# Patient Record
Sex: Female | Born: 1975 | Race: White | Hispanic: No | Marital: Married | Smoking: Never smoker
Health system: Southern US, Community
[De-identification: ages and names within clinical notes are randomized; demographics above are authoritative.]

## PROBLEM LIST (undated history)

## (undated) ENCOUNTER — Inpatient Hospital Stay (HOSPITAL_COMMUNITY): Payer: Self-pay

## (undated) DIAGNOSIS — Z862 Personal history of diseases of the blood and blood-forming organs and certain disorders involving the immune mechanism: Secondary | ICD-10-CM

## (undated) DIAGNOSIS — B977 Papillomavirus as the cause of diseases classified elsewhere: Secondary | ICD-10-CM

## (undated) DIAGNOSIS — K219 Gastro-esophageal reflux disease without esophagitis: Secondary | ICD-10-CM

## (undated) DIAGNOSIS — Z9889 Other specified postprocedural states: Secondary | ICD-10-CM

## (undated) DIAGNOSIS — G43909 Migraine, unspecified, not intractable, without status migrainosus: Secondary | ICD-10-CM

## (undated) DIAGNOSIS — Z87442 Personal history of urinary calculi: Secondary | ICD-10-CM

## (undated) DIAGNOSIS — Z8709 Personal history of other diseases of the respiratory system: Secondary | ICD-10-CM

## (undated) DIAGNOSIS — R87629 Unspecified abnormal cytological findings in specimens from vagina: Secondary | ICD-10-CM

## (undated) DIAGNOSIS — F419 Anxiety disorder, unspecified: Secondary | ICD-10-CM

## (undated) DIAGNOSIS — R112 Nausea with vomiting, unspecified: Secondary | ICD-10-CM

## (undated) DIAGNOSIS — B009 Herpesviral infection, unspecified: Secondary | ICD-10-CM

## (undated) HISTORY — DX: Personal history of urinary calculi: Z87.442

## (undated) HISTORY — DX: Herpesviral infection, unspecified: B00.9

## (undated) HISTORY — DX: Migraine, unspecified, not intractable, without status migrainosus: G43.909

## (undated) HISTORY — PX: APPENDECTOMY: SHX54

## (undated) HISTORY — PX: WISDOM TOOTH EXTRACTION: SHX21

## (undated) HISTORY — DX: Papillomavirus as the cause of diseases classified elsewhere: B97.7

## (undated) SURGERY — Surgical Case
Anesthesia: *Unknown

---

## 2005-09-20 ENCOUNTER — Inpatient Hospital Stay (HOSPITAL_COMMUNITY): Admission: AD | Admit: 2005-09-20 | Discharge: 2005-09-22 | Payer: Self-pay | Admitting: Urology

## 2005-10-30 ENCOUNTER — Other Ambulatory Visit: Admission: RE | Admit: 2005-10-30 | Discharge: 2005-10-30 | Payer: Self-pay | Admitting: Obstetrics and Gynecology

## 2008-04-19 ENCOUNTER — Other Ambulatory Visit: Admission: RE | Admit: 2008-04-19 | Discharge: 2008-04-19 | Payer: Self-pay | Admitting: Obstetrics and Gynecology

## 2009-05-25 ENCOUNTER — Other Ambulatory Visit: Admission: RE | Admit: 2009-05-25 | Discharge: 2009-05-25 | Payer: Self-pay | Admitting: Obstetrics and Gynecology

## 2010-06-21 ENCOUNTER — Other Ambulatory Visit: Payer: Self-pay | Admitting: Adult Health

## 2010-06-21 ENCOUNTER — Other Ambulatory Visit (HOSPITAL_COMMUNITY)
Admission: RE | Admit: 2010-06-21 | Discharge: 2010-06-21 | Disposition: A | Discharge status: Home / Self Care | Payer: BC Managed Care – PPO | Source: Ambulatory Visit | Attending: Obstetrics and Gynecology | Admitting: Obstetrics and Gynecology

## 2010-06-21 DIAGNOSIS — Z124 Encounter for screening for malignant neoplasm of cervix: Secondary | ICD-10-CM | POA: Insufficient documentation

## 2010-09-29 NOTE — H&P (Signed)
NAME:  Kimberly Holt, Kimberly Holt NO.:  [id number]   MEDICAL RECORD NO.:  [id number]          PATIENT TYPE:  MAT   LOCATION:  [facility]                          FACILITY:  [facility]   PHYSICIAN:  Naima A. Dillard, M.D. DATE OF BIRTH:  Oct 04, 1975   DATE OF ADMISSION:  09/20/2005  DATE OF DISCHARGE:                                HISTORY & PHYSICAL   HISTORY OF PRESENT ILLNESS:  This is a 35 year old gravida 1, para 0 at 49-  5/7 weeks, who presents with gush of fluid with a few contractions that are  now increased.  She reports positive fetal movement.  Pregnancy has been  followed by the nurse midwife service and remarkable for:  #1 - First  trimester bleeding, #2 - HSV-2, #3 - migraines, #4 - group B strep negative.   ALLERGIES:  CODEINE causes vomiting.   OBSTETRICAL HISTORY:  The patient is a primigravida.   MEDICAL HISTORY:  1.  Abnormal Pap in 2002.  2.  History of HSV-2 and childhood varicella.  3.  She also has a history of anemia that did not require iron.  4.  She has a history of migraines.   FAMILY HISTORY:  Family history is remarkable for an uncle with heart  disease, father with hypertension, father with diabetes, mother and cousin  with thyroid disease, aunt with seizures, a cousin with MS and several  family members with brain and lung cancer.   GENETIC HISTORY:  Genetic history is remarkable for uncle with a congenital  heart problem and several family members with twins and father of the baby's  sister with a SIDS baby.   SOCIAL HISTORY:  The patient is married to [place], who is involved and  supportive.  She is of the [place] faith.  She denies any alcohol, tobacco  or drug use.   PRENATAL LABORATORY DATA:  Hemoglobin 13.5, platelets 195,000.  Blood type O-  positive, antibody screen negative.  RPR negative.  Rubella immune.  Hepatitis and HIV both negative.  Gonorrhea and Chlamydia negative.   HISTORY OF CURRENT PREGNANCY:  The patient entered care at 6  weeks'  gestation.  She had an ultrasound to confirm dates.  She had a normal nuchal  translucency ultrasound at 11 weeks.  Quad screen was normal.  She had an  anatomy ultrasound at 18 weeks that was normal, questionable femur length  shortening on early ultrasound, which later was not confirmed and was found  to be normal.  She had a Glucola that was normal and group B strep at term  was negative.   OBJECTIVE DATA:  VITAL SIGNS:  Stable, afebrile.  HEENT:  Within normal limits.  NECK:  Thyroid normal, not enlarged.  CHEST:  Clear to auscultation.  HEART:  Regular rate and rhythm.  ABDOMEN:  Gravid, 38 cm, vertex to Leopold's.  EFM shows reactive fetal  heart rate with contractions every 2-3 minutes.  PELVIC:  Exam shows no herpetic lesions visible.  Cervix is 3-4, 90%, -1  station with a vertex presentation.  EXTREMITIES:  Within normal limits.   ASSESSMENT:  1.  Intrauterine pregnancy at 38-5/7 weeks.  2.  Premature rupture of membranes at term.  3.  Early labor.   PLAN:  1.  Admit to birthing suites, Dr. Normand Sloop notified.  2.  Routine C.N.M. orders.  3.  Stadol and epidural p.r.n.      Marie L. Williams, C.N.M.      Naima A. Normand Sloop, M.D.  Electronically Signed    MLW/MEDQ  D:  09/20/2005  T:  09/20/2005  Job:  [id number]

## 2011-01-29 ENCOUNTER — Other Ambulatory Visit: Payer: Self-pay | Admitting: Adult Health

## 2011-08-29 ENCOUNTER — Other Ambulatory Visit (HOSPITAL_COMMUNITY): Payer: Self-pay | Admitting: Urology

## 2011-08-29 ENCOUNTER — Ambulatory Visit (HOSPITAL_COMMUNITY)
Admission: RE | Admit: 2011-08-29 | Discharge: 2011-08-29 | Disposition: A | Discharge status: Home / Self Care | Payer: BC Managed Care – PPO | Source: Ambulatory Visit | Attending: Urology | Admitting: Urology

## 2011-08-29 DIAGNOSIS — N201 Calculus of ureter: Secondary | ICD-10-CM | POA: Insufficient documentation

## 2011-09-27 ENCOUNTER — Other Ambulatory Visit: Payer: Self-pay | Admitting: Urology

## 2011-10-01 ENCOUNTER — Encounter (HOSPITAL_BASED_OUTPATIENT_CLINIC_OR_DEPARTMENT_OTHER): Payer: Self-pay | Admitting: *Deleted

## 2011-10-01 NOTE — Progress Notes (Signed)
NPO AFTER MN. ARRIVES AT 0745. NEEDS HG AND URINE PREG.

## 2011-10-04 NOTE — Anesthesia Preprocedure Evaluation (Addendum)
Anesthesia Evaluation  Patient identified by MRN, date of birth, ID band Patient awake    Reviewed: Allergy & Precautions, H&P , NPO status , Patient's Chart, lab work & pertinent test results  Airway Mallampati: II TM Distance: >3 FB Neck ROM: Full    Dental No notable dental hx.    Pulmonary neg pulmonary ROS,  breath sounds clear to auscultation  Pulmonary exam normal       Cardiovascular negative cardio ROS  Rhythm:Regular Rate:Normal     Neuro/Psych negative neurological ROS  negative psych ROS   GI/Hepatic negative GI ROS, Neg liver ROS,   Endo/Other  negative endocrine ROS  Renal/GU negative Renal ROS  negative genitourinary   Musculoskeletal negative musculoskeletal ROS (+)   Abdominal   Peds negative pediatric ROS (+)  Hematology negative hematology ROS (+)   Anesthesia Other Findings   Reproductive/Obstetrics negative OB ROS                           Anesthesia Physical Anesthesia Plan  ASA: I  Anesthesia Plan: General   Post-op Pain Management:    Induction: Intravenous  Airway Management Planned: LMA  Additional Equipment:   Intra-op Plan:   Post-operative Plan: Extubation in OR  Informed Consent: I have reviewed the patients History and Physical, chart, labs and discussed the procedure including the risks, benefits and alternatives for the proposed anesthesia with the patient or authorized representative who has indicated his/her understanding and acceptance.   Dental advisory given  Plan Discussed with: CRNA  Anesthesia Plan Comments: (Easily nauseated with narcotics. Does well with toradol. Phenergan helps.)       Anesthesia Quick Evaluation

## 2011-10-05 ENCOUNTER — Encounter (HOSPITAL_BASED_OUTPATIENT_CLINIC_OR_DEPARTMENT_OTHER): Payer: Self-pay | Admitting: Anesthesiology

## 2011-10-05 ENCOUNTER — Encounter (HOSPITAL_BASED_OUTPATIENT_CLINIC_OR_DEPARTMENT_OTHER)
Admission: RE | Disposition: A | Discharge status: Home / Self Care | Payer: Self-pay | Source: Ambulatory Visit | Attending: Urology

## 2011-10-05 ENCOUNTER — Ambulatory Visit (HOSPITAL_BASED_OUTPATIENT_CLINIC_OR_DEPARTMENT_OTHER)
Admission: RE | Admit: 2011-10-05 | Discharge: 2011-10-05 | Disposition: A | Discharge status: Home / Self Care | Payer: BC Managed Care – PPO | Source: Ambulatory Visit | Attending: Urology | Admitting: Urology

## 2011-10-05 ENCOUNTER — Ambulatory Visit (HOSPITAL_BASED_OUTPATIENT_CLINIC_OR_DEPARTMENT_OTHER): Payer: BC Managed Care – PPO | Admitting: Anesthesiology

## 2011-10-05 ENCOUNTER — Encounter (HOSPITAL_BASED_OUTPATIENT_CLINIC_OR_DEPARTMENT_OTHER): Payer: Self-pay | Admitting: *Deleted

## 2011-10-05 DIAGNOSIS — Z79899 Other long term (current) drug therapy: Secondary | ICD-10-CM | POA: Insufficient documentation

## 2011-10-05 DIAGNOSIS — N201 Calculus of ureter: Secondary | ICD-10-CM | POA: Insufficient documentation

## 2011-10-05 HISTORY — DX: Personal history of diseases of the blood and blood-forming organs and certain disorders involving the immune mechanism: Z86.2

## 2011-10-05 HISTORY — PX: CYSTOSCOPY/RETROGRADE/URETEROSCOPY: SHX5316

## 2011-10-05 LAB — POCT PREGNANCY, URINE: Preg Test, Ur: NEGATIVE

## 2011-10-05 LAB — POCT HEMOGLOBIN-HEMACUE: Hemoglobin: 12.6 g/dL (ref 12.0–15.0)

## 2011-10-05 SURGERY — CYSTOSCOPY/RETROGRADE/URETEROSCOPY
Anesthesia: General | Site: Ureter | Laterality: Right | Wound class: Clean Contaminated

## 2011-10-05 MED ORDER — FENTANYL CITRATE 0.05 MG/ML IJ SOLN: 25.0000 ug | INTRAMUSCULAR | Status: DC | PRN

## 2011-10-05 MED ORDER — ONDANSETRON HCL 4 MG/2ML IJ SOLN
INTRAMUSCULAR | Status: DC | PRN
  Administered 2011-10-05: 4 mg via INTRAVENOUS

## 2011-10-05 MED ORDER — LIDOCAINE HCL 2 % EX GEL
CUTANEOUS | Status: DC | PRN
  Administered 2011-10-05: 1

## 2011-10-05 MED ORDER — IOHEXOL 350 MG/ML SOLN
INTRAVENOUS | Status: DC | PRN
  Administered 2011-10-05: 50 mL

## 2011-10-05 MED ORDER — LACTATED RINGERS IV SOLN
INTRAVENOUS | Status: DC
  Administered 2011-10-05: 10:00:00 via INTRAVENOUS
  Administered 2011-10-05: 100 mL/h via INTRAVENOUS

## 2011-10-05 MED ORDER — METOCLOPRAMIDE HCL 5 MG/ML IJ SOLN
INTRAMUSCULAR | Status: DC | PRN
  Administered 2011-10-05: 5 mg via INTRAVENOUS

## 2011-10-05 MED ORDER — PROMETHAZINE HCL 25 MG/ML IJ SOLN: 6.2500 mg | INTRAMUSCULAR | Status: DC | PRN

## 2011-10-05 MED ORDER — MIDAZOLAM HCL 5 MG/5ML IJ SOLN
INTRAMUSCULAR | Status: DC | PRN
  Administered 2011-10-05: 2 mg via INTRAVENOUS

## 2011-10-05 MED ORDER — DROPERIDOL 2.5 MG/ML IJ SOLN
INTRAMUSCULAR | Status: DC | PRN
  Administered 2011-10-05: 0.625 mg via INTRAVENOUS

## 2011-10-05 MED ORDER — PROPOFOL 10 MG/ML IV EMUL
INTRAVENOUS | Status: DC | PRN
  Administered 2011-10-05: 200 mg via INTRAVENOUS

## 2011-10-05 MED ORDER — KETOROLAC TROMETHAMINE 30 MG/ML IJ SOLN
INTRAMUSCULAR | Status: DC | PRN
  Administered 2011-10-05: 30 mg via INTRAVENOUS

## 2011-10-05 MED ORDER — CEFAZOLIN SODIUM 1-5 GM-% IV SOLN
1.0000 g | INTRAVENOUS | Status: AC
  Administered 2011-10-05: 1 g via INTRAVENOUS

## 2011-10-05 MED ORDER — SODIUM CHLORIDE 0.9 % IR SOLN
Status: DC | PRN
  Administered 2011-10-05: 6000 mL

## 2011-10-05 MED ORDER — FENTANYL CITRATE 0.05 MG/ML IJ SOLN
INTRAMUSCULAR | Status: DC | PRN
  Administered 2011-10-05: 25 ug via INTRAVENOUS
  Administered 2011-10-05: 50 ug via INTRAVENOUS
  Administered 2011-10-05: 25 ug via INTRAVENOUS
  Administered 2011-10-05 (×2): 50 ug via INTRAVENOUS

## 2011-10-05 MED ORDER — DEXAMETHASONE SODIUM PHOSPHATE 4 MG/ML IJ SOLN
INTRAMUSCULAR | Status: DC | PRN
  Administered 2011-10-05: 10 mg via INTRAVENOUS

## 2011-10-05 MED ORDER — LIDOCAINE HCL (CARDIAC) 20 MG/ML IV SOLN
INTRAVENOUS | Status: DC | PRN
  Administered 2011-10-05: 80 mg via INTRAVENOUS

## 2011-10-05 SURGICAL SUPPLY — 34 items
ADAPTER CATH URET PLST 4-6FR (CATHETERS) IMPLANT
BAG DRAIN URO-CYSTO SKYTR STRL (DRAIN) ×2 IMPLANT
BASKET LASER NITINOL 1.9FR (BASKET) IMPLANT
BASKET STNLS GEMINI 4WIRE 3FR (BASKET) IMPLANT
BASKET ZERO TIP NITINOL 2.4FR (BASKET) ×2 IMPLANT
BRUSH URET BIOPSY 3F (UROLOGICAL SUPPLIES) IMPLANT
CANISTER SUCT LVC 12 LTR MEDI- (MISCELLANEOUS) ×2 IMPLANT
CATH INTERMIT  6FR 70CM (CATHETERS) IMPLANT
CATH URET 5FR 28IN CONE TIP (BALLOONS) ×1
CATH URET 5FR 28IN OPEN ENDED (CATHETERS) IMPLANT
CATH URET 5FR 70CM CONE TIP (BALLOONS) ×1 IMPLANT
CLOTH BEACON ORANGE TIMEOUT ST (SAFETY) ×2 IMPLANT
DRAPE CAMERA CLOSED 9X96 (DRAPES) ×2 IMPLANT
ELECT REM PT RETURN 9FT ADLT (ELECTROSURGICAL)
ELECTRODE REM PT RTRN 9FT ADLT (ELECTROSURGICAL) IMPLANT
GLOVE BIO SURGEON STRL SZ7 (GLOVE) ×2 IMPLANT
GLOVE BIOGEL PI IND STRL 7.0 (GLOVE) ×1 IMPLANT
GLOVE BIOGEL PI INDICATOR 7.0 (GLOVE) ×1
GLOVE ECLIPSE 7.0 STRL STRAW (GLOVE) ×2 IMPLANT
GOWN PREVENTION PLUS LG XLONG (DISPOSABLE) IMPLANT
GUIDEWIRE 0.038 PTFE COATED (WIRE) IMPLANT
GUIDEWIRE ANG ZIPWIRE 038X150 (WIRE) IMPLANT
GUIDEWIRE STR DUAL SENSOR (WIRE) ×2 IMPLANT
IV NS IRRIG 3000ML ARTHROMATIC (IV SOLUTION) ×8 IMPLANT
KIT BALLIN UROMAX 15FX10 (LABEL) IMPLANT
KIT BALLN UROMAX 15FX4 (MISCELLANEOUS) IMPLANT
KIT BALLN UROMAX 26 75X4 (MISCELLANEOUS)
LASER FIBER DISP (UROLOGICAL SUPPLIES) IMPLANT
LASER FIBER DISP 1000U (UROLOGICAL SUPPLIES) IMPLANT
NS IRRIG 500ML POUR BTL (IV SOLUTION) ×2 IMPLANT
PACK CYSTOSCOPY (CUSTOM PROCEDURE TRAY) ×2 IMPLANT
SET HIGH PRES BAL DIL (LABEL)
SHEATH URET ACCESS 12FR/35CM (UROLOGICAL SUPPLIES) ×2 IMPLANT
SHEATH URET ACCESS 12FR/55CM (UROLOGICAL SUPPLIES) IMPLANT

## 2011-10-05 NOTE — Transfer of Care (Signed)
Immediate Anesthesia Transfer of Care Note  Patient: Kimberly Holt  Procedure(s) Performed: Procedure(s) (LRB): CYSTOSCOPY/RETROGRADE/URETEROSCOPY (Right) HOLMIUM LASER APPLICATION (Right) CYSTOSCOPY WITH STENT PLACEMENT (Right)  Patient Location: PACU  Anesthesia Type: General  Level of Consciousness: awake,drowsy and oriented  Airway & Oxygen Therapy: Patient Spontanous Breathing and Patient connected to nasal cannula oxygen  Post-op Assessment: Report given to PACU RN and Post -op Vital signs reviewed and stable  Post vital signs: Reviewed and stable  Complications: No apparent anesthesia complications

## 2011-10-05 NOTE — Op Note (Signed)
Kimberly Holt is a 36 y.o.   10/05/2011  Preop diagnosis: Right distal ureteral calculus.  Postop diagnosis: Same  Procedure done: Cystoscopy, right retrograde pyelogram, ureteroscopy, holmium laser of ureteral stone, stone extraction, insertion of double-J stent.  Surgeon: Wendie Simmer. Linton Stolp  Anesthesia: General  Indication: Patient is a 36 years old female who has been complaining of right lower quadrant pain on and off for the past 4 weeks. A CT scan showed a 5 mm stone in the proximal ureter. She was treated with medical expulsive therapy. She has not been able to pass the stone and has continued to complain of pain. KUB revealed that the stone had migrated in the distal ureter. She is scheduled today for cystoscopy, stone manipulation.  Procedure: Patient was identified by her wrist band and proper timeout was taken.  Under general anesthesia she was prepped and draped and placed in the dorsolithotomy position. It was difficult to pass a panendoscope through the meatus. The meatus was then dilated up to #28 [place]. The cystoscope was then inserted in the bladder. The bladder mucosa is normal. There is no stone or tumor in the bladder. The ureteral orifices are in normal position and shape.  Retrograde pyelogram:  A cone-tip catheter was passed through the cystoscope and the right ureteral orifice. Omnipaque was then injected through the cone-tip catheter. There is a filling defect in the distal ureter about 5 cm proximal to the ureteral orifice. The ureter proximal to the stone is moderately dilated. The cone-tip catheter was then removed. A sensor wire was passed in the right ureter.  The intramural ureter was then dilated with the ureteroscope access sheath. A semirigid ureteroscope was then inserted in the distal ureter. The stone was visualized. There is some edema around the stone. The stone was then fragmented with a 365 microfiber holmium laser. The stone fragments were then removed with  a Nitinol basket.  Contrast was then injected through the ureteroscope and there was no evidence of extravasation of contrast or filling defect in the ureter. The ureteroscope was then advanced up to the proximal ureter and there was no remaining stone fragment in the ureter. The ureteroscope was then removed. The guidewire was then backloaded into the cystoscope and a #6 French-24 double-J stent was passed over the sensor wire. The sensor wire was then removed. The proximal curl of the double-J stent is in the renal pelvis; the distal curl is in the bladder. A string was left attached to the double-J stent.  The patient tolerated the procedure well and left the OR in satisfactory condition to postanesthesia care unit.

## 2011-10-05 NOTE — Anesthesia Postprocedure Evaluation (Signed)
  Anesthesia Post-op Note  Patient: Kimberly Holt  Procedure(s) Performed: Procedure(s) (LRB): CYSTOSCOPY/RETROGRADE/URETEROSCOPY (Right) HOLMIUM LASER APPLICATION (Right) CYSTOSCOPY WITH STENT PLACEMENT (Right)  Patient Location: PACU  Anesthesia Type: General  Level of Consciousness: awake and alert   Airway and Oxygen Therapy: Patient Spontanous Breathing  Post-op Pain: mild  Post-op Assessment: Post-op Vital signs reviewed, Patient's Cardiovascular Status Stable, Respiratory Function Stable, Patent Airway and No signs of Nausea or vomiting  Post-op Vital Signs: stable  Complications: No apparent anesthesia complications

## 2011-10-05 NOTE — Anesthesia Procedure Notes (Signed)
Procedure Name: LMA Insertion Date/Time: 10/05/2011 9:04 AM Performed by: Norva Pavlov Pre-anesthesia Checklist: Patient identified, Emergency Drugs available, Suction available and Patient being monitored Patient Re-evaluated:Patient Re-evaluated prior to inductionOxygen Delivery Method: Circle System Utilized Preoxygenation: Pre-oxygenation with 100% oxygen Intubation Type: IV induction Ventilation: Mask ventilation without difficulty LMA: LMA inserted LMA Size: 4.0 Number of attempts: 1 Airway Equipment and Method: bite block Placement Confirmation: positive ETCO2 Tube secured with: Tape Dental Injury: Teeth and Oropharynx as per pre-operative assessment

## 2011-10-05 NOTE — H&P (Signed)
History of Present Illness  [name] returns for follow-up.  She has not passed the stone.  She still has right lower quadrant pain on and off.  The pain was severe yesterday and associated with nausea and vomiting.  KUB shows the stone may have migrated distally about 2 cm.   Surgical History Problems  1. History of  Appendectomy  Current Meds 1. Ketorolac Tromethamine 10 MG Oral Tablet; Therapy: (Recorded:18Apr2013) to 2. Tamsulosin HCl 0.4 MG Oral Capsule; TAKE 1 CAPSULE Bedtime  Requested for: 03May2013;  Last Rx:03May2013 3. Tylenol TABS; Therapy: (Recorded:18Apr2013) to 4. Ultram 50 MG Oral Tablet; Therapy: (Recorded:18Apr2013) to  Allergies Medication  1. Codeine Derivatives  Family History Problems  1. Family history of  Family Health Status - Father's Age 41 2. Family history of  Family Health Status - Mother's Age 54 3. Family history of  Family Health Status Number Of Children 1 son  Social History Problems  1. Caffeine Use 2 2. Marital History - Currently Married 3. Never A Smoker 4. Occupation: Administrator, arts Denied  5. History of  Alcohol Use  Review of Systems Genitourinary, constitutional, skin, eye, otolaryngeal, hematologic/lymphatic, cardiovascular, pulmonary, endocrine, musculoskeletal, gastrointestinal, neurological and psychiatric system(s) were reviewed and pertinent findings if present are noted.  Gastrointestinal: nausea, vomiting and abdominal pain.    Vitals Vital Signs [Data Includes: Last 1 Day]  16May2013 11:58AM  Blood Pressure: 103 / 70 Temperature: 97.4 F Heart Rate: 73  Physical Exam Constitutional: Well nourished and well developed . No acute distress.  ENT:. The ears and nose are normal in appearance.  Neck: The appearance of the neck is normal and no neck mass is present.  Pulmonary: No respiratory distress and normal respiratory rhythm and effort.  Cardiovascular: Heart rate and rhythm are normal . No  peripheral edema.  Abdomen: The abdomen is soft and nontender. No masses are palpated. No CVA tenderness. No hernias are palpable. No hepatosplenomegaly noted.  Genitourinary: Examination of the external genitalia shows normal female external genitalia and no lesions. The urethra is normal in appearance and not tender. There is no urethral mass. Vaginal exam demonstrates no abnormalities. The adnexa are palpably normal. The bladder is non tender and not distended. The anus is normal on inspection. The perineum is normal on inspection.  Lymphatics: The femoral and inguinal nodes are not enlarged or tender.  Skin: Normal skin turgor, no visible rash and no visible skin lesions.  Neuro/Psych:. Mood and affect are appropriate.    Results/Data Urine [Data Includes: Last 1 Day]   16May2013  COLOR AMBER   APPEARANCE CLEAR   SPECIFIC GRAVITY >1.030   pH 5.5   GLUCOSE NEG mg/dL  BILIRUBIN SMALL   KETONE TRACE mg/dL  BLOOD LARGE   PROTEIN 100 mg/dL  UROBILINOGEN 1 mg/dL  NITRITE POS   LEUKOCYTE ESTERASE TRACE   SQUAMOUS EPITHELIAL/HPF RARE   WBC 0-3 WBC/hpf  RBC TNTC RBC/hpf  BACTERIA RARE   CRYSTALS NONE SEEN   CASTS NONE SEEN   Other SEE NOTE     KUB  INDICATION: Right distal ureteral calculus  KUB shows normal bowel gas pattern. Psoas shadows are normal.   The stone migrated 2 cm distally.  IMPRESSION: Right distal ureteral calculus.  Amended By: Su Grand; 09/27/2011 1:49 PMEST  Assessment Assessed  1. Distal Ureteral Stone On The Right 592.1  Plan Distal Ureteral Stone On The Right (592.1)  1. Follow-up Schedule Surgery Office  Follow-up  Done: 16May2013 Health Maintenance (V70.0)  2. UA With REFLEX  Done: 16May2013 10:22AM Proximal Ureteral Stone On The Right (592.1)  3. KUB  Done: 16May2013 12:00AM   She needs stone manipulation.  The procedure, risks, benefits were discussed with the patient.  The risks include but are not limited to hemorrhage, infection, ureteral  injury, inability to extract the stone.  She understands and is now willing to proceed.   Signatures Electronically signed by : Su Grand, M.D.; Sep 27 2011  1:51PM

## 2011-10-05 NOTE — Discharge Instructions (Signed)

## 2011-10-09 ENCOUNTER — Encounter (HOSPITAL_BASED_OUTPATIENT_CLINIC_OR_DEPARTMENT_OTHER): Payer: Self-pay | Admitting: Urology

## 2011-10-09 NOTE — Progress Notes (Signed)
Patient had stent removed today stating having a lot of bleeding and pain. Instructed to get off of feet and increase fluid.. Also instructed to touch base with surgeon if a lot of blood and pain. Patient very emotional over blood and pain was going to take suggestion and call doctors office.

## 2012-08-12 ENCOUNTER — Encounter: Payer: Self-pay | Admitting: *Deleted

## 2012-08-12 ENCOUNTER — Other Ambulatory Visit: Payer: BC Managed Care – PPO

## 2012-08-12 DIAGNOSIS — B009 Herpesviral infection, unspecified: Secondary | ICD-10-CM | POA: Insufficient documentation

## 2012-08-12 DIAGNOSIS — B977 Papillomavirus as the cause of diseases classified elsewhere: Secondary | ICD-10-CM

## 2012-08-12 DIAGNOSIS — N979 Female infertility, unspecified: Secondary | ICD-10-CM

## 2012-08-12 DIAGNOSIS — G43909 Migraine, unspecified, not intractable, without status migrainosus: Secondary | ICD-10-CM

## 2012-08-13 ENCOUNTER — Telehealth: Payer: Self-pay | Admitting: Adult Health

## 2012-08-13 NOTE — Telephone Encounter (Signed)
Spoke with pt about progesterone results. Notified the pt that JAG said that the level was good and that she had ovulated. The pt stated that they did have sex. I also advised, per JAG, to let her know if she had a period or not. Pt voiced understanding.

## 2012-08-25 ENCOUNTER — Telehealth: Payer: Self-pay | Admitting: Adult Health

## 2012-08-25 MED ORDER — CLOMIPHENE CITRATE 50 MG PO TABS: ORAL_TABLET | ORAL | Status: DC

## 2012-08-25 NOTE — Telephone Encounter (Signed)
Spoke with pt. She did start her period on Saturday. Will need to start Clomid today. Pt wants you to call her back.

## 2012-08-25 NOTE — Telephone Encounter (Signed)
Got period Saturday wants to get clomid called in to [facility]

## 2012-09-05 ENCOUNTER — Telehealth: Payer: Self-pay | Admitting: Adult Health

## 2012-09-05 NOTE — Telephone Encounter (Signed)
Left message to call me back.

## 2012-09-11 ENCOUNTER — Other Ambulatory Visit: Payer: BC Managed Care – PPO

## 2012-09-11 DIAGNOSIS — N970 Female infertility associated with anovulation: Secondary | ICD-10-CM

## 2012-09-11 LAB — PROGESTERONE: Progesterone: 39.8 ng/mL

## 2012-09-12 ENCOUNTER — Telehealth: Payer: Self-pay | Admitting: Adult Health

## 2012-09-12 NOTE — Telephone Encounter (Signed)
Pt aware progesterone level 39.8

## 2012-09-23 ENCOUNTER — Telehealth: Payer: Self-pay | Admitting: Adult Health

## 2012-09-23 NOTE — Telephone Encounter (Signed)
Has not started her period yet, had negative urine pregnancy test at home, if no bleeding by Thursday come get blood QHCG.

## 2012-09-25 ENCOUNTER — Telehealth: Payer: Self-pay | Admitting: Adult Health

## 2012-09-25 NOTE — Telephone Encounter (Signed)
Started last night, will take clomid 150 mg again and get husband to increase intake of vitamin C(orange juice and oranges daily)

## 2012-10-22 ENCOUNTER — Other Ambulatory Visit: Payer: Self-pay

## 2012-10-22 ENCOUNTER — Telehealth: Payer: Self-pay | Admitting: Adult Health

## 2012-10-22 ENCOUNTER — Ambulatory Visit: Payer: BC Managed Care – PPO | Admitting: Adult Health

## 2012-10-22 DIAGNOSIS — N979 Female infertility, unspecified: Secondary | ICD-10-CM

## 2012-10-22 DIAGNOSIS — Z3200 Encounter for pregnancy test, result unknown: Secondary | ICD-10-CM

## 2012-10-22 NOTE — Telephone Encounter (Signed)
Left message to call in am  

## 2012-10-22 NOTE — Telephone Encounter (Signed)
Pt states been taking clomid, took pregnancy test and it was positive. Wants to know if Cyril Mourning, NP wants her to come in for blood work to be done.

## 2012-10-22 NOTE — Telephone Encounter (Signed)
Per Cyril Mourning, NP, pt to come in today for progesterone and QHCG.

## 2012-10-23 ENCOUNTER — Telehealth: Payer: Self-pay | Admitting: Adult Health

## 2012-10-23 LAB — HCG, QUANTITATIVE, PREGNANCY: hCG, Beta Chain, Quant, S: 100.1 m[IU]/mL

## 2012-10-23 NOTE — Telephone Encounter (Signed)
Left message to call me back.

## 2012-10-23 NOTE — Telephone Encounter (Signed)
Pt aware of labs make appt in 2 weeks for [place]

## 2012-10-29 ENCOUNTER — Other Ambulatory Visit: Payer: Self-pay | Admitting: Obstetrics & Gynecology

## 2012-10-29 DIAGNOSIS — O0901 Supervision of pregnancy with history of infertility, first trimester: Secondary | ICD-10-CM

## 2012-10-31 ENCOUNTER — Encounter: Payer: Self-pay | Admitting: *Deleted

## 2012-10-31 NOTE — Progress Notes (Signed)
Patient ID: Kimberly Holt, female   DOB: December 18, 1975, 37 y.o.   MRN: [id number] Pt here for labs only.

## 2012-11-03 ENCOUNTER — Ambulatory Visit (INDEPENDENT_AMBULATORY_CARE_PROVIDER_SITE_OTHER): Payer: BC Managed Care – PPO

## 2012-11-03 ENCOUNTER — Ambulatory Visit (INDEPENDENT_AMBULATORY_CARE_PROVIDER_SITE_OTHER): Payer: BC Managed Care – PPO | Admitting: Adult Health

## 2012-11-03 ENCOUNTER — Encounter: Payer: Self-pay | Admitting: Adult Health

## 2012-11-03 VITALS — BP 120/84 | Ht 68.5 in | Wt 169.0 lb

## 2012-11-03 DIAGNOSIS — O0900 Supervision of pregnancy with history of infertility, unspecified trimester: Secondary | ICD-10-CM

## 2012-11-03 DIAGNOSIS — Z87442 Personal history of urinary calculi: Secondary | ICD-10-CM

## 2012-11-03 DIAGNOSIS — Z3490 Encounter for supervision of normal pregnancy, unspecified, unspecified trimester: Secondary | ICD-10-CM

## 2012-11-03 DIAGNOSIS — Z1389 Encounter for screening for other disorder: Secondary | ICD-10-CM

## 2012-11-03 DIAGNOSIS — O0901 Supervision of pregnancy with history of infertility, first trimester: Secondary | ICD-10-CM

## 2012-11-03 HISTORY — DX: Personal history of urinary calculi: Z87.442

## 2012-11-03 LAB — POCT URINALYSIS DIPSTICK
Blood, UA: NEGATIVE
Protein, UA: NEGATIVE

## 2012-11-03 NOTE — Patient Instructions (Addendum)
Call in am for results Return in 2 weeks for [place] Pregnancy - First Trimester During sexual intercourse, millions of sperm go into the vagina. Only 1 sperm will penetrate and fertilize the female egg while it is in the Fallopian tube. One week later, the fertilized egg implants into the wall of the uterus. An embryo begins to develop into a baby. At 6 to 8 weeks, the eyes and face are formed and the heartbeat can be seen on ultrasound. At the end of 12 weeks (first trimester), all the baby's organs are formed. Now that you are pregnant, you will want to do everything you can to have a healthy baby. Two of the most important things are to get good prenatal care and follow your caregiver's instructions. Prenatal care is all the medical care you receive before the baby's birth. It is given to prevent, find, and treat problems during the pregnancy and childbirth. PRENATAL EXAMS  During prenatal visits, your weight, blood pressure, and urine are checked. This is done to make sure you are healthy and progressing normally during the pregnancy.  A pregnant woman should gain 25 to 35 pounds during the pregnancy. However, if you are overweight or underweight, your caregiver will advise you regarding your weight.  Your caregiver will ask and answer questions for you.  Blood work, cervical cultures, other necessary tests, and a Pap test are done during your prenatal exams. These tests are done to check on your health and the probable health of your baby. Tests are strongly recommended and done for HIV with your permission. This is the virus that causes AIDS. These tests are done because medicines can be given to help prevent your baby from being born with this infection should you have been infected without knowing it. Blood work is also used to find out your blood type, previous infections, and follow your blood levels (hemoglobin).  Low hemoglobin (anemia) is common during pregnancy. Iron and vitamins are given to  help prevent this. Later in the pregnancy, blood tests for diabetes will be done along with any other tests if any problems develop.  You may need other tests to make sure you and the baby are doing well. CHANGES DURING THE FIRST TRIMESTER  Your body goes through many changes during pregnancy. They vary from person to person. Talk to your caregiver about changes you notice and are concerned about. Changes can include:  Your menstrual period stops.  The egg and sperm carry the genes that determine what you look like. Genes from you and your partner are forming a baby. The female genes determine whether the baby is a boy or a girl.  Your body increases in girth and you may feel bloated.  Feeling sick to your stomach (nauseous) and throwing up (vomiting). If the vomiting is uncontrollable, call your caregiver.  Your breasts will begin to enlarge and become tender.  Your nipples may stick out more and become darker.  The need to urinate more. Painful urination may mean you have a bladder infection.  Tiring easily.  Loss of appetite.  Cravings for certain kinds of food.  At first, you may gain or lose a couple of pounds.  You may have changes in your emotions from day to day (excited to be pregnant or concerned something may go wrong with the pregnancy and baby).  You may have more vivid and strange dreams. HOME CARE INSTRUCTIONS   It is very important to avoid all smoking, alcohol and non-prescribed drugs during  your pregnancy. These affect the formation and growth of the baby. Avoid chemicals while pregnant to ensure the delivery of a healthy infant.  Start your prenatal visits by the 12th week of pregnancy. They are usually scheduled monthly at first, then more often in the last 2 months before delivery. Keep your caregiver's appointments. Follow your caregiver's instructions regarding medicine use, blood and lab tests, exercise, and diet.  During pregnancy, you are providing food  for you and your baby. Eat regular, well-balanced meals. Choose foods such as meat, fish, milk and other low fat dairy products, vegetables, fruits, and whole-grain breads and cereals. Your caregiver will tell you of the ideal weight gain.  You can help morning sickness by keeping soda crackers at the bedside. Eat a couple before arising in the morning. You may want to use the crackers without salt on them.  Eating 4 to 5 small meals rather than 3 large meals a day also may help the nausea and vomiting.  Drinking liquids between meals instead of during meals also seems to help nausea and vomiting.  A physical sexual relationship may be continued throughout pregnancy if there are no other problems. Problems may be early (premature) leaking of amniotic fluid from the membranes, vaginal bleeding, or belly (abdominal) pain.  Exercise regularly if there are no restrictions. Check with your caregiver or physical therapist if you are unsure of the safety of some of your exercises. Greater weight gain will occur in the last 2 trimesters of pregnancy. Exercising will help:  Control your weight.  Keep you in shape.  Prepare you for labor and delivery.  Help you lose your pregnancy weight after you deliver your baby.  Wear a good support or jogging bra for breast tenderness during pregnancy. This may help if worn during sleep too.  Ask when prenatal classes are available. Begin classes when they are offered.  Do not use hot tubs, steam rooms, or saunas.  Wear your seat belt when driving. This protects you and your baby if you are in an accident.  Avoid raw meat, uncooked cheese, cat litter boxes, and soil used by cats throughout the pregnancy. These carry germs that can cause birth defects in the baby.  The first trimester is a good time to visit your dentist for your dental health. Getting your teeth cleaned is okay. Use a softer toothbrush and brush gently during pregnancy.  Ask for help if  you have financial, counseling, or nutritional needs during pregnancy. Your caregiver will be able to offer counseling for these needs as well as refer you for other special needs.  Do not take any medicines or herbs unless told by your caregiver.  Inform your caregiver if there is any mental or physical domestic violence.  Make a list of emergency phone numbers of family, friends, hospital, and police and fire departments.  Write down your questions. Take them to your prenatal visit.  Do not douche.  Do not cross your legs.  If you have to stand for long periods of time, rotate you feet or take small steps in a circle.  You may have more vaginal secretions that may require a sanitary pad. Do not use tampons or scented sanitary pads. MEDICINES AND DRUG USE IN PREGNANCY  Take prenatal vitamins as directed. The vitamin should contain 1 milligram of folic acid. Keep all vitamins out of reach of children. Only a couple vitamins or tablets containing iron may be fatal to a baby or young child when ingested.  Avoid use of all medicines, including herbs, over-the-counter medicines, not prescribed or suggested by your caregiver. Only take over-the-counter or prescription medicines for pain, discomfort, or fever as directed by your caregiver. Do not use aspirin, ibuprofen, or naproxen unless directed by your caregiver.  Let your caregiver also know about herbs you may be using.  Alcohol is related to a number of birth defects. This includes fetal alcohol syndrome. All alcohol, in any form, should be avoided completely. Smoking will cause low birth rate and premature babies.  Street or illegal drugs are very harmful to the baby. They are absolutely forbidden. A baby born to an addicted mother will be addicted at birth. The baby will go through the same withdrawal an adult does.  Let your caregiver know about any medicines that you have to take and for what reason you take them. SEEK MEDICAL CARE  IF:  You have any concerns or worries during your pregnancy. It is better to call with your questions if you feel they cannot wait, rather than worry about them. SEEK IMMEDIATE MEDICAL CARE IF:   An unexplained oral temperature above 102 F (38.9 C) develops, or as your caregiver suggests.  You have leaking of fluid from the vagina (birth canal). If leaking membranes are suspected, take your temperature and inform your caregiver of this when you call.  There is vaginal spotting or bleeding. Notify your caregiver of the amount and how many pads are used.  You develop a bad smelling vaginal discharge with a change in the color.  You continue to feel sick to your stomach (nauseated) and have no relief from remedies suggested. You vomit blood or coffee ground-like materials.  You lose more than 2 pounds of weight in 1 week.  You gain more than 2 pounds of weight in 1 week and you notice swelling of your face, hands, feet, or legs.  You gain 5 pounds or more in 1 week (even if you do not have swelling of your hands, face, legs, or feet).  You get exposed to [place] measles and have never had them.  You are exposed to fifth disease or chickenpox.  You develop belly (abdominal) pain. Round ligament discomfort is a common non-cancerous (benign) cause of abdominal pain in pregnancy. Your caregiver still must evaluate this.  You develop headache, fever, diarrhea, pain with urination, or shortness of breath.  You fall or are in a car accident or have any kind of trauma.  There is mental or physical violence in your home. Document Released: 04/24/2001 Document Revised: 01/23/2012 Document Reviewed: 10/26/2008 [facility] Patient Information 2014 [place], [place].

## 2012-11-03 NOTE — Progress Notes (Signed)
Subjective:     Patient ID: Kimberly Holt, female   DOB: 1976/03/19, 37 y.o.   MRN: [id number]  HPI Kimberly Holt is a 37 year old white female married in for [place], she took Clomid 150 mg and is pregnant.Her progesterone level was 88.  Review of Systems No complaints Reviewed past medical,surgical, social and family history. Reviewed medications and allergies.     Objective:   Physical Exam BP 120/84  Ht 5' 8.5" (1.74 m)  Wt 169 lb (76.658 kg)  BMI 25.32 kg/m2  LMP 09/24/2012   Reviewed [place] with pt, she has an IUP with +yolk sac and right ovarian cyst and some free fluid  Assessment:      Early IUP    Plan:      Will check QHCG and progesterone level today  Return in 2 weeks for follow up US

## 2012-11-04 ENCOUNTER — Telehealth: Payer: Self-pay | Admitting: Adult Health

## 2012-11-04 LAB — HCG, QUANTITATIVE, PREGNANCY: hCG, Beta Chain, Quant, S: 19751.2 m[IU]/mL

## 2012-11-04 NOTE — Telephone Encounter (Signed)
Pt aware of labs, keep appt in 2 weeks for [place]

## 2012-11-17 ENCOUNTER — Other Ambulatory Visit: Payer: Self-pay | Admitting: Obstetrics & Gynecology

## 2012-11-17 DIAGNOSIS — O3680X0 Pregnancy with inconclusive fetal viability, not applicable or unspecified: Secondary | ICD-10-CM

## 2012-11-18 ENCOUNTER — Ambulatory Visit (INDEPENDENT_AMBULATORY_CARE_PROVIDER_SITE_OTHER): Payer: BC Managed Care – PPO | Admitting: Adult Health

## 2012-11-18 ENCOUNTER — Other Ambulatory Visit: Payer: Self-pay | Admitting: Obstetrics & Gynecology

## 2012-11-18 ENCOUNTER — Ambulatory Visit (INDEPENDENT_AMBULATORY_CARE_PROVIDER_SITE_OTHER): Payer: BC Managed Care – PPO

## 2012-11-18 ENCOUNTER — Encounter: Payer: Self-pay | Admitting: Adult Health

## 2012-11-18 VITALS — BP 110/60 | Ht 68.5 in | Wt 169.0 lb

## 2012-11-18 DIAGNOSIS — Z3490 Encounter for supervision of normal pregnancy, unspecified, unspecified trimester: Secondary | ICD-10-CM

## 2012-11-18 DIAGNOSIS — O09521 Supervision of elderly multigravida, first trimester: Secondary | ICD-10-CM

## 2012-11-18 DIAGNOSIS — O3680X0 Pregnancy with inconclusive fetal viability, not applicable or unspecified: Secondary | ICD-10-CM

## 2012-11-18 DIAGNOSIS — O0901 Supervision of pregnancy with history of infertility, first trimester: Secondary | ICD-10-CM

## 2012-11-18 DIAGNOSIS — N83209 Unspecified ovarian cyst, unspecified side: Secondary | ICD-10-CM

## 2012-11-18 DIAGNOSIS — O09529 Supervision of elderly multigravida, unspecified trimester: Secondary | ICD-10-CM

## 2012-11-18 DIAGNOSIS — O09299 Supervision of pregnancy with other poor reproductive or obstetric history, unspecified trimester: Secondary | ICD-10-CM

## 2012-11-18 DIAGNOSIS — O0900 Supervision of pregnancy with history of infertility, unspecified trimester: Secondary | ICD-10-CM

## 2012-11-18 NOTE — Patient Instructions (Addendum)

## 2012-11-18 NOTE — Progress Notes (Signed)
Subjective:     Patient ID: Kimberly Holt, female   DOB: [birth year], 37 y.o.   MRN: [id number]  HPI Kimberly Holt is back for [place], which showed a CRL of 1.38 cm which equals 7+5 weeks, and matches LMP of 7+6 weeks with EDC of 07/01/13 and +FHM of 155, she also has 2 simple and 1 complex right ovarian cyst.Small amount free fluid in CDS.She did take Clomid 150 mg to get pregnant. Having some nausea with dry mouth,and feels faint at times, but declines meds, so we discussed other ways to help the nausea.  Review of Systems Positives in HPI Reviewed past medical,surgical, social and family history. Reviewed medications and allergies.     Objective:   Physical Exam BP 110/60  Ht 5' 8.5" (1.74 m)  Wt 169 lb (76.658 kg)  BMI 25.32 kg/m2  LMP 09/24/2012   Talk only Assessment:      Pregnant Right ovarian cysts    Plan:     Relnate DHA 1 daily, Number of samples 15  Lot number [id number]     Exp date 7/14 to try. Eat often and try gold fish and sweet tea, lemon drops, she does not like popsicles or gum. Call [facility] and make new ob appt Call prn

## 2012-11-18 NOTE — Progress Notes (Signed)
U/S(7+6wks)-transvaginal u/s, single IUP with +FCA noted, CRL c/w LMP dates, cx long and closed, RT ovary with multiple cysts noted #1-simple 3.6cm, #2-simple 2.0cm, #3-complex 2.4cm, small amount of free fluid noted in posterior cul-de-sac

## 2012-11-24 ENCOUNTER — Telehealth: Payer: Self-pay | Admitting: Adult Health

## 2012-11-24 MED ORDER — DOXYLAMINE-PYRIDOXINE 10-10 MG PO TBEC: 2.0000 | DELAYED_RELEASE_TABLET | ORAL | Status: DC

## 2012-11-24 NOTE — Telephone Encounter (Signed)
Left message to check with [facility] regarding diclegis.

## 2012-11-24 NOTE — Telephone Encounter (Signed)
Pt requesting Diclegis e-scribed for N/V.

## 2012-11-28 ENCOUNTER — Telehealth: Payer: Self-pay | Admitting: Adult Health

## 2012-11-28 NOTE — Telephone Encounter (Signed)
Spoke with pt. Having nausea, but its much better with Diclegis. Feels like she's gonna pass out off and on throughout the day. No headache when this happens. Pt wanted you to know this. I let her know you were out of the office until Monday but would send you the message. Advised if she got worse over the weekend to go to ER. Pt voiced understanding. JSY

## 2012-12-01 NOTE — Telephone Encounter (Signed)
Feels faint at times not as nauseous, try eating every 2 hours make sure it has protein, has appt Friday with [facility]

## 2012-12-05 LAB — OB RESULTS CONSOLE GC/CHLAMYDIA
Chlamydia: NEGATIVE
GC PROBE AMP, GENITAL: NEGATIVE

## 2013-01-29 ENCOUNTER — Inpatient Hospital Stay (HOSPITAL_COMMUNITY)
Admission: AD | Admit: 2013-01-29 | Discharge: 2013-01-29 | Disposition: A | Discharge status: Home / Self Care | Payer: BC Managed Care – PPO | Source: Ambulatory Visit | Attending: Obstetrics and Gynecology | Admitting: Obstetrics and Gynecology

## 2013-01-29 ENCOUNTER — Encounter (HOSPITAL_COMMUNITY): Payer: Self-pay | Admitting: *Deleted

## 2013-01-29 DIAGNOSIS — O210 Mild hyperemesis gravidarum: Secondary | ICD-10-CM | POA: Insufficient documentation

## 2013-01-29 DIAGNOSIS — R1110 Vomiting, unspecified: Secondary | ICD-10-CM | POA: Diagnosis present

## 2013-01-29 LAB — URINALYSIS, DIPSTICK ONLY
Bilirubin Urine: NEGATIVE
Ketones, ur: >80 mg/dL — AB
Leukocytes, UA: NEGATIVE
Nitrite: NEGATIVE
Urobilinogen, UA: 0.2 mg/dL (ref 0.0–1.0)
pH: 6 (ref 5.0–8.0)

## 2013-01-29 MED ORDER — PANTOPRAZOLE SODIUM 40 MG PO TBEC
40.0000 mg | DELAYED_RELEASE_TABLET | Freq: Every day | ORAL | Status: DC
  Filled 2013-01-29: qty 1

## 2013-01-29 MED ORDER — LACTATED RINGERS IV SOLN
INTRAVENOUS | Status: DC
  Administered 2013-01-29: 21:00:00 via INTRAVENOUS

## 2013-01-29 MED ORDER — ONDANSETRON HCL 4 MG/2ML IJ SOLN
4.0000 mg | Freq: Once | INTRAMUSCULAR | Status: AC
  Administered 2013-01-29: 4 mg via INTRAVENOUS
  Filled 2013-01-29: qty 2

## 2013-01-29 MED ORDER — GI COCKTAIL ~~LOC~~
30.0000 mL | Freq: Once | ORAL | Status: AC
  Administered 2013-01-29: 30 mL via ORAL
  Filled 2013-01-29: qty 30

## 2013-01-29 MED ORDER — PANTOPRAZOLE SODIUM 40 MG PO TBEC: 40.0000 mg | DELAYED_RELEASE_TABLET | Freq: Every day | ORAL | Status: DC

## 2013-01-29 MED ORDER — PANTOPRAZOLE SODIUM 40 MG IV SOLR
40.0000 mg | INTRAVENOUS | Status: DC
  Administered 2013-01-29: 40 mg via INTRAVENOUS
  Filled 2013-01-29: qty 40

## 2013-01-29 NOTE — MAU Provider Note (Signed)
History   37 yo [id number] at 58 1/7 weeks presented from the office for IV hydration and anti-emetic tx due to exacerbation of hyperemesis.  She has been on Diclegis at home, with daily vomiting, but in reasonable control.  Vomiting increased on Tuesday evening, unable to keep f/f down, with no benefit from home meds.  Denies fever, viral exposure, bleeding, dysuria, back pain, or d/c.  Patient Active Problem List   Diagnosis Date Noted  . Hyperemesis 01/29/2013  . History of kidney stones 11/03/2012  . Pregnant 11/03/2012     Chief Complaint  Patient presents with  . Nausea  . Emesis During Pregnancy     OB History   Grav Para Term Preterm Abortions TAB SAB Ect Mult Living   4 1   2  2   1       Past Medical History  Diagnosis Date  . Right ureteral stone   . Constipation   . History of anemia   . Frequency of urination   . Urgency of urination   . Nocturia   . Hematuria   . Right flank pain   . History of kidney stones 11/03/2012    Past Surgical History  Procedure Laterality Date  . Appendectomy  AGE 2  . Cystoscopy/retrograde/ureteroscopy  10/05/2011    Procedure: CYSTOSCOPY/RETROGRADE/URETEROSCOPY;  Surgeon: Lindaann Slough, MD;  Location: [facility];  Service: Urology;  Laterality: Right;    History reviewed. No pertinent family history.  History  Substance Use Topics  . Smoking status: Never Smoker   . Smokeless tobacco: Never Used  . Alcohol Use: No    Allergies:  Allergies  Allergen Reactions  . Codeine Nausea And Vomiting    SEVERE  . Hydrocodone Nausea And Vomiting    SEVERE    Prescriptions prior to admission  Medication Sig Dispense Refill  . acetaminophen (TYLENOL) 500 MG tablet Take 500 mg by mouth every 6 (six) hours as needed for pain.       . Doxylamine-Pyridoxine (DICLEGIS PO) Take 2 tablets by mouth at bedtime.      . Prenatal Vit-Fe Fumarate-FA (MULTIVITAMIN-PRENATAL) 27-0.8 MG TABS Take 1 tablet by mouth at bedtime.           Physical Exam   Blood pressure 105/61, pulse 104, temperature 97.8 F (36.6 C), temperature source Oral, resp. rate 18, weight 167 lb 12.8 oz (76.114 kg), last menstrual period 09/24/2012.  Lips dry and cracked. Chest clear  Heart RRR without murmur Abd gravid, NT Pelvic--deferred Ext WNL  FHR 148 by dopper  Results for orders placed during the hospital encounter of 01/29/13 (from the past 24 hour(s))  URINALYSIS, DIPSTICK ONLY     Status: Abnormal   Collection Time    01/29/13  7:15 PM      Result Value Range   Specific Gravity, Urine >1.030 (*) 1.005 - 1.030   pH 6.0  5.0 - 8.0   Glucose, UA NEGATIVE  NEGATIVE mg/dL   Hgb urine dipstick NEGATIVE  NEGATIVE   Bilirubin Urine NEGATIVE  NEGATIVE   Ketones, ur >80 (*) NEGATIVE mg/dL   Protein, ur NEGATIVE  NEGATIVE mg/dL   Urobilinogen, UA 0.2  0.0 - 1.0 mg/dL   Nitrite NEGATIVE  NEGATIVE   Leukocytes, UA NEGATIVE  NEGATIVE    ED Course  IUP at 18 1/7 weeks Exacerbation of hyperemesis  Plan: IV hydration Zofran GI cocktail ordered by Dr. Estanislado Pandy Re-evaluation after hydration and meds.  Nigel Bridgeman CNM, [place] 01/29/2013  8:17 PM  Addendum: Feeling better after repeat dose of Zofran and IV Protonix, continued IV fluids. Tolerating po fluids and few crackers. D/C'd home. OOW tomorrow. Rx Protonix 40 mg po daily--sent to patient's pharmacy Continue Diclegis--may add daytime dose tomorrow. Hyperemesis precautions reviewed. Keep scheduled appt next Friday for [place] and ROB.  Nigel Bridgeman, CNM 01/29/13 10:40pm

## 2013-01-29 NOTE — MAU Note (Signed)
Pt discharged to home no pain, nausea, or vomiting.  Pt tolerated ice chips and crackers.

## 2013-01-29 NOTE — MAU Note (Signed)
Haven't been able to eat or drink anything since Tues evening.  Can't keep anything down.  Called and was seen at office, + ketones in urine. Sent over for fluids.

## 2013-03-19 ENCOUNTER — Other Ambulatory Visit: Payer: Self-pay

## 2013-04-13 ENCOUNTER — Encounter (HOSPITAL_COMMUNITY): Payer: Self-pay

## 2013-04-13 ENCOUNTER — Inpatient Hospital Stay (HOSPITAL_COMMUNITY)
Admission: AD | Admit: 2013-04-13 | Discharge: 2013-04-13 | Disposition: A | Discharge status: Home / Self Care | Payer: BC Managed Care – PPO | Source: Ambulatory Visit | Attending: Obstetrics and Gynecology | Admitting: Obstetrics and Gynecology

## 2013-04-13 DIAGNOSIS — O368190 Decreased fetal movements, unspecified trimester, not applicable or unspecified: Secondary | ICD-10-CM | POA: Insufficient documentation

## 2013-04-13 DIAGNOSIS — O368390 Maternal care for abnormalities of the fetal heart rate or rhythm, unspecified trimester, not applicable or unspecified: Secondary | ICD-10-CM | POA: Insufficient documentation

## 2013-04-13 NOTE — MAU Note (Signed)
Patient is sent in from the office for further fetal monitoring. She had 8/8 on her BPP. Patient states that she was being monitored at the office and "baby heart beat dropped". She c/o of a mild discomfort at right lower quadrant of her abdomen. Her abdomen is not bruised, non-tender and soft. She denies any bleeding, LOF or contractions. She reports decreased fetal movement. Patient states that she fell onto her abdomen while attempting to pick up toys at 0800am today.

## 2013-04-13 NOTE — Progress Notes (Signed)
S. lillard cnm notified of patient, her presenting complains. Her tracing. Will come to evaluate patient in MAU.

## 2013-05-14 NOTE — L&D Delivery Note (Signed)
Delivery Note At 10:14 PM a viable female, "Kimberly Holt", was delivered via  (Presentation: LOA ;  ).  APGAR: 9, 9; weight 8 lb 12.7 oz (3989 g).   Placenta status: Intact, Spontaneous.  Cord: 3 vessels with the following complications: CAN x 1, reduced over body at delivery.  Cord pH: NA  Anesthesia: Epidural Local  Episiotomy: None Lacerations: 2nd degree;Perineal Suture Repair: 3.0 vicryl rapide Est. Blood Loss (mL): 300 cc  Mom to postpartum.  Baby to Couplet care / Skin to Skin. Plan inpatient circumcision. Patient temp 101.3 at 2256--consulted with Dr. Raphael Gibney. Unasyn 3 gm IV q 6 hours x 2 doses. Placenta to path.  Addendum: Patient advised me she desired BTL--had talked to Dr. Mancel Bale during PN visit. Dr. Raphael Gibney notified.  He will schedule. Patient to remain NPO after MN and will have IV site and epidural cath maintained.   Donnel Saxon 06/23/2013, 11:26 PM

## 2013-06-23 ENCOUNTER — Encounter (HOSPITAL_COMMUNITY): Payer: Self-pay | Admitting: *Deleted

## 2013-06-23 ENCOUNTER — Inpatient Hospital Stay (HOSPITAL_COMMUNITY): Payer: BC Managed Care – PPO | Admitting: Anesthesiology

## 2013-06-23 ENCOUNTER — Inpatient Hospital Stay (HOSPITAL_COMMUNITY)
Admission: AD | Admit: 2013-06-23 | Discharge: 2013-06-26 | DRG: 774 | Disposition: A | Discharge status: Home / Self Care | Payer: BC Managed Care – PPO | Source: Ambulatory Visit | Attending: Obstetrics and Gynecology | Admitting: Obstetrics and Gynecology

## 2013-06-23 ENCOUNTER — Encounter (HOSPITAL_COMMUNITY): Payer: BC Managed Care – PPO | Admitting: Anesthesiology

## 2013-06-23 DIAGNOSIS — O99344 Other mental disorders complicating childbirth: Secondary | ICD-10-CM | POA: Diagnosis present

## 2013-06-23 DIAGNOSIS — IMO0001 Reserved for inherently not codable concepts without codable children: Secondary | ICD-10-CM

## 2013-06-23 DIAGNOSIS — A6000 Herpesviral infection of urogenital system, unspecified: Secondary | ICD-10-CM | POA: Diagnosis present

## 2013-06-23 DIAGNOSIS — F411 Generalized anxiety disorder: Secondary | ICD-10-CM | POA: Diagnosis present

## 2013-06-23 DIAGNOSIS — O98519 Other viral diseases complicating pregnancy, unspecified trimester: Secondary | ICD-10-CM | POA: Diagnosis present

## 2013-06-23 DIAGNOSIS — R197 Diarrhea, unspecified: Secondary | ICD-10-CM | POA: Diagnosis not present

## 2013-06-23 DIAGNOSIS — O411090 Infection of amniotic sac and membranes, unspecified, unspecified trimester, not applicable or unspecified: Secondary | ICD-10-CM | POA: Diagnosis present

## 2013-06-23 DIAGNOSIS — O09529 Supervision of elderly multigravida, unspecified trimester: Secondary | ICD-10-CM | POA: Diagnosis present

## 2013-06-23 HISTORY — DX: Anxiety disorder, unspecified: F41.9

## 2013-06-23 HISTORY — DX: Unspecified abnormal cytological findings in specimens from vagina: R87.629

## 2013-06-23 LAB — TYPE AND SCREEN
ABO/RH(D): O POS
Antibody Screen: NEGATIVE

## 2013-06-23 LAB — OB RESULTS CONSOLE HIV ANTIBODY (ROUTINE TESTING): HIV: NONREACTIVE

## 2013-06-23 LAB — OB RESULTS CONSOLE RPR: RPR: NONREACTIVE

## 2013-06-23 LAB — CBC
HCT: 30.9 % — ABNORMAL LOW (ref 36.0–46.0)
HEMOGLOBIN: 10.3 g/dL — AB (ref 12.0–15.0)
MCH: 29.2 pg (ref 26.0–34.0)
MCHC: 33.3 g/dL (ref 30.0–36.0)
MCV: 87.5 fL (ref 78.0–100.0)
Platelets: 185 10*3/uL (ref 150–400)
RBC: 3.53 MIL/uL — AB (ref 3.87–5.11)
RDW: 14.6 % (ref 11.5–15.5)
WBC: 10.8 10*3/uL — AB (ref 4.0–10.5)

## 2013-06-23 LAB — RPR: RPR Ser Ql: NONREACTIVE

## 2013-06-23 LAB — OB RESULTS CONSOLE RUBELLA ANTIBODY, IGM: Rubella: IMMUNE

## 2013-06-23 LAB — OB RESULTS CONSOLE GBS: STREP GROUP B AG: NEGATIVE

## 2013-06-23 LAB — OB RESULTS CONSOLE ANTIBODY SCREEN: Antibody Screen: NEGATIVE

## 2013-06-23 LAB — ABO/RH: ABO/RH(D): O POS

## 2013-06-23 LAB — OB RESULTS CONSOLE ABO/RH: RH Type: POSITIVE

## 2013-06-23 LAB — OB RESULTS CONSOLE HEPATITIS B SURFACE ANTIGEN: Hepatitis B Surface Ag: NEGATIVE

## 2013-06-23 MED ORDER — ONDANSETRON HCL 4 MG/2ML IJ SOLN
4.0000 mg | Freq: Once | INTRAMUSCULAR | Status: AC
  Administered 2013-06-23: 4 mg via INTRAVENOUS

## 2013-06-23 MED ORDER — SODIUM CHLORIDE 0.9 % IV SOLN
3.0000 g | Freq: Four times a day (QID) | INTRAVENOUS | Status: AC
  Administered 2013-06-23 – 2013-06-24 (×2): 3 g via INTRAVENOUS
  Filled 2013-06-23 (×3): qty 3

## 2013-06-23 MED ORDER — IBUPROFEN 600 MG PO TABS
600.0000 mg | ORAL_TABLET | Freq: Four times a day (QID) | ORAL | Status: DC | PRN
  Administered 2013-06-23: 600 mg via ORAL
  Filled 2013-06-23: qty 1

## 2013-06-23 MED ORDER — PROMETHAZINE HCL 25 MG/ML IJ SOLN
12.5000 mg | INTRAMUSCULAR | Status: AC
  Administered 2013-06-23: 12.5 mg via INTRAVENOUS
  Filled 2013-06-23: qty 1

## 2013-06-23 MED ORDER — BUTORPHANOL TARTRATE 1 MG/ML IJ SOLN: 1.0000 mg | INTRAMUSCULAR | Status: DC | PRN

## 2013-06-23 MED ORDER — PHENYLEPHRINE 40 MCG/ML (10ML) SYRINGE FOR IV PUSH (FOR BLOOD PRESSURE SUPPORT)
80.0000 ug | PREFILLED_SYRINGE | INTRAVENOUS | Status: AC | PRN
  Administered 2013-06-23 (×3): 80 ug via INTRAVENOUS

## 2013-06-23 MED ORDER — VALACYCLOVIR HCL 500 MG PO TABS
500.0000 mg | ORAL_TABLET | Freq: Every day | ORAL | Status: DC
  Filled 2013-06-23 (×2): qty 1

## 2013-06-23 MED ORDER — FENTANYL 2.5 MCG/ML BUPIVACAINE 1/10 % EPIDURAL INFUSION (WH - ANES)
INTRAMUSCULAR | Status: DC | PRN
  Administered 2013-06-23: 14 mL/h via EPIDURAL

## 2013-06-23 MED ORDER — LACTATED RINGERS IV SOLN
500.0000 mL | INTRAVENOUS | Status: DC | PRN
  Administered 2013-06-23: 1000 mL via INTRAVENOUS

## 2013-06-23 MED ORDER — CITRIC ACID-SODIUM CITRATE 334-500 MG/5ML PO SOLN
30.0000 mL | ORAL | Status: DC | PRN
Start: 2013-06-23 — End: 2013-06-24

## 2013-06-23 MED ORDER — LACTATED RINGERS IV SOLN
500.0000 mL | Freq: Once | INTRAVENOUS | Status: AC
  Administered 2013-06-23: 500 mL via INTRAVENOUS

## 2013-06-23 MED ORDER — ACETAMINOPHEN 325 MG PO TABS: 650.0000 mg | ORAL_TABLET | ORAL | Status: DC | PRN

## 2013-06-23 MED ORDER — DIPHENHYDRAMINE HCL 50 MG/ML IJ SOLN: 12.5000 mg | INTRAMUSCULAR | Status: DC | PRN

## 2013-06-23 MED ORDER — PHENYLEPHRINE 40 MCG/ML (10ML) SYRINGE FOR IV PUSH (FOR BLOOD PRESSURE SUPPORT)
80.0000 ug | PREFILLED_SYRINGE | INTRAVENOUS | Status: DC | PRN
  Administered 2013-06-23: 80 ug via INTRAVENOUS
  Filled 2013-06-23 (×2): qty 10
  Filled 2013-06-23: qty 2

## 2013-06-23 MED ORDER — EPHEDRINE 5 MG/ML INJ
10.0000 mg | INTRAVENOUS | Status: DC | PRN
  Administered 2013-06-23: 10 mg via INTRAVENOUS
  Filled 2013-06-23: qty 4
  Filled 2013-06-23: qty 2

## 2013-06-23 MED ORDER — OXYTOCIN 40 UNITS IN LACTATED RINGERS INFUSION - SIMPLE MED
1.0000 m[IU]/min | INTRAVENOUS | Status: DC
  Administered 2013-06-23: 1 m[IU]/min via INTRAVENOUS
  Filled 2013-06-23: qty 1000

## 2013-06-23 MED ORDER — PRENATAL MULTIVITAMIN CH: 1.0000 | ORAL_TABLET | Freq: Every day | ORAL | Status: DC

## 2013-06-23 MED ORDER — OXYTOCIN BOLUS FROM INFUSION
500.0000 mL | INTRAVENOUS | Status: DC
  Administered 2013-06-23: 500 mL via INTRAVENOUS

## 2013-06-23 MED ORDER — TERBUTALINE SULFATE 1 MG/ML IJ SOLN: 0.2500 mg | Freq: Once | INTRAMUSCULAR | Status: AC | PRN

## 2013-06-23 MED ORDER — LIDOCAINE HCL (PF) 1 % IJ SOLN
INTRAMUSCULAR | Status: DC | PRN
  Administered 2013-06-23 (×2): 9 mL

## 2013-06-23 MED ORDER — EPHEDRINE 5 MG/ML INJ
10.0000 mg | INTRAVENOUS | Status: DC | PRN
  Filled 2013-06-23: qty 2

## 2013-06-23 MED ORDER — LIDOCAINE HCL (PF) 1 % IJ SOLN
30.0000 mL | INTRAMUSCULAR | Status: AC | PRN
  Administered 2013-06-23: 30 mL via SUBCUTANEOUS
  Filled 2013-06-23: qty 30

## 2013-06-23 MED ORDER — OXYCODONE-ACETAMINOPHEN 5-325 MG PO TABS: 1.0000 | ORAL_TABLET | ORAL | Status: DC | PRN

## 2013-06-23 MED ORDER — OXYTOCIN 40 UNITS IN LACTATED RINGERS INFUSION - SIMPLE MED: 62.5000 mL/h | INTRAVENOUS | Status: DC

## 2013-06-23 MED ORDER — ONDANSETRON HCL 4 MG/2ML IJ SOLN
4.0000 mg | Freq: Four times a day (QID) | INTRAMUSCULAR | Status: DC | PRN
Start: 2013-06-23 — End: 2013-06-24
  Administered 2013-06-23 (×2): 4 mg via INTRAVENOUS
  Filled 2013-06-23 (×3): qty 2

## 2013-06-23 MED ORDER — FENTANYL 2.5 MCG/ML BUPIVACAINE 1/10 % EPIDURAL INFUSION (WH - ANES)
14.0000 mL/h | INTRAMUSCULAR | Status: DC | PRN
  Administered 2013-06-23: 14 mL/h via EPIDURAL
  Filled 2013-06-23 (×2): qty 125

## 2013-06-23 MED ORDER — LACTATED RINGERS IV SOLN
INTRAVENOUS | Status: DC
  Administered 2013-06-23: 18:00:00 via INTRAVENOUS

## 2013-06-23 NOTE — H&P (Signed)
Admission History and Physical Exam for an Obstetrics Patient      Prenatal Transfer Tool  Maternal Diabetes: No Genetic Screening: Normal Maternal Ultrasounds/Referrals: Normal Fetal Ultrasounds or other Referrals:  None Maternal Substance Abuse:  No Significant Maternal Medications:  Valtrex Significant Maternal Lab Results:  None Other Comments:  The patient has had her flu shot and her TDAP shot.  [name] is a 38 y.o. female, [id number], at [redacted]w[redacted]d gestation, who presents for evaluation of his contractions. She has been followed at the [facility] Gynecology division of [facility]. She denies bleeding and rupture membranes. She has had contractions off and on. They get stronger and then they get weaker. See history below.  OB History    Grav  Para  Term  Preterm  Abortions  TAB  SAB  Ect  Mult  Living    4  1  1   2   2    1       Past Medical History   Diagnosis  Date   .  Right ureteral stone    .  Constipation    .  History of anemia    .  Frequency of urination    .  Urgency of urination    .  Nocturia    .  Hematuria    .  Right flank pain    .  History of kidney stones  11/03/2012   .  Headache(784.0)    .  Anemia    .  Infection      UTI   .  Anxiety    .  Vaginal Pap smear, abnormal      rpt was WNL   HSV  Prescriptions prior to admission   Medication  Sig  Dispense  Refill   .  acetaminophen (TYLENOL) 500 MG tablet  Take 500 mg by mouth every 6 (six) hours as needed for pain.     .  Doxylamine-Pyridoxine (DICLEGIS PO)  Take 2 tablets by mouth at bedtime.     .  pantoprazole (PROTONIX) 40 MG tablet  Take 1 tablet (40 mg total) by mouth daily.  30 tablet  4   .  Prenatal Vit-Fe Fumarate-FA (PRENATAL MULTIVITAMIN) TABS tablet  Take 1 tablet by mouth daily at 12 noon.     .  valACYclovir (VALTREX) 500 MG tablet  Take 500 mg by mouth daily.      Past Surgical History   Procedure  Laterality  Date   .  Appendectomy    AGE 18   .  Cystoscopy/retrograde/ureteroscopy   10/05/2011     Procedure: CYSTOSCOPY/RETROGRADE/URETEROSCOPY; Surgeon: Hanley Ben, MD; [facility]; Service: Urology; Laterality: Right;    Allergies   Allergen  Reactions   .  Codeine  Nausea And Vomiting     SEVERE   .  Hydrocodone  Nausea And Vomiting     SEVERE    Family History: family history includes Cancer in her maternal grandmother, paternal aunt, and paternal grandmother; Diabetes in her father; Heart disease in her paternal uncle.  Social History: reports that she has never smoked. She has never used smokeless tobacco. She reports that she does not drink alcohol or use illicit drugs.  Review of systems: Normal pregnancy complaints.  Admission Physical Exam:  Dilation: 4.5  Effacement (%): 60  Station: (high)  Exam by:: jolynn There is no weight on file to calculate BMI.  Blood pressure 133/78, pulse 108,  temperature 98.3 F (36.8 C), temperature source Oral, resp. rate 18, last menstrual period 09/24/2012.  HEENT: Within normal limits  Chest: Clear  Heart: Regular rate and rhythm  Abdomen: Gravid and nontender  Extremities: Grossly normal  Neurologic exam: Grossly normal  Prenatal labs:  ABO, Rh: O+  Antibody: Neg  Rubella: Immune  RPR: NR  HBsAg: NR  HIV: NR  GBS: Neg  NST: Category category 1; Contractions: Mild .  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  Anxiety  Advanced maternal age (normal [place] screen)   Plan:   Allow the patient to walk. Reevaluate cervix in 2-3 hours.    Cervix is 5 cm dilated, 50% effaced, -3 station. After walking.  No evidence of HSV infection on speculum exam.  Will admit for delivery. Anticipate vaginal delivery.    Kimberly Holt 06/23/2013, 1:01 PM

## 2013-06-23 NOTE — Progress Notes (Signed)
  Subjective: Comfortable with epidural.  Family at bedside.  Objective: BP 133/89  Pulse 122  Temp(Src) 98.1 F (36.7 C) (Oral)  Resp 18  Ht 5' 8.5" (1.74 m)  Wt 202 lb (91.627 kg)  BMI 30.26 kg/m2  LMP 09/24/2012      FHT:  Category 1 UC:   q 3-4 min, moderate SVE:   Dilation: 6.5 Effacement (%): 80 Station: -2 Exam by:: Windy Kalata CNM--cervix stretchy, but vtx not well-applied. IUPC placed  Assessment / Plan: Active labor, likely inadequate contractions Will start pitocin augmentation.  Donnel Saxon 06/23/2013, 7:28 PM

## 2013-06-23 NOTE — Anesthesia Preprocedure Evaluation (Signed)
Anesthesia Evaluation  Patient identified by MRN, date of birth, ID band Patient awake    Reviewed: Allergy & Precautions, H&P , NPO status , Patient's Chart, lab work & pertinent test results  Airway Mallampati: II TM Distance: >3 FB Neck ROM: full    Dental no notable dental hx.    Pulmonary neg pulmonary ROS,    Pulmonary exam normal       Cardiovascular negative cardio ROS      Neuro/Psych    GI/Hepatic Neg liver ROS, GERD-  ,  Endo/Other  negative endocrine ROS  Renal/GU negative Renal ROS     Musculoskeletal   Abdominal Normal abdominal exam  (+)   Peds  Hematology negative hematology ROS (+)   Anesthesia Other Findings   Reproductive/Obstetrics (+) Pregnancy                           Anesthesia Physical Anesthesia Plan  ASA: II  Anesthesia Plan: Epidural   Post-op Pain Management:    Induction:   Airway Management Planned:   Additional Equipment:   Intra-op Plan:   Post-operative Plan:   Informed Consent: I have reviewed the patients History and Physical, chart, labs and discussed the procedure including the risks, benefits and alternatives for the proposed anesthesia with the patient or authorized representative who has indicated his/her understanding and acceptance.     Plan Discussed with:   Anesthesia Plan Comments:         Anesthesia Quick Evaluation

## 2013-06-23 NOTE — MAU Note (Signed)
Has been having contractions, and then they ease off.  Seem to be stronger today. No bleeding or leaking.  Was almost 4cm last wk

## 2013-06-23 NOTE — Progress Notes (Signed)
  Subjective: Feeling more lower pelvic pain, but afraid of redose due to hypotensive episode with last redose.  Nausea and vomiting--has received 2 doses Zofran in last hour.  Objective: BP 112/56  Pulse 105  Temp(Src) 100 F (37.8 C) (Axillary)  Resp 18  Ht 5' 8.5" (1.74 m)  Wt 202 lb (91.627 kg)  BMI 30.26 kg/m2  LMP 09/24/2012      FHT:  Category 1 UC:   regular, every 2-3 minutes SVE:   Dilation: 8.5 Effacement (%): 80 Station: 0 Exam by:: Windy Kalata CNM  Assessment / Plan: Progressive labor Hot spot, but hx hypotensive episode with last redose N/V  Continue augmentation. Hold redose at present--patient coping with pain. Phenergan 12.5 mg IV now. Anticipate progression of labor. Watch temp.  Donnel Saxon 06/23/2013, 9:14 PM

## 2013-06-23 NOTE — Anesthesia Procedure Notes (Signed)
Epidural Patient location during procedure: OB Start time: 06/23/2013 2:43 PM End time: 06/23/2013 2:47 PM  Staffing Anesthesiologist: Lyn Hollingshead Performed by: anesthesiologist   Preanesthetic Checklist Completed: patient identified, surgical consent, pre-op evaluation, timeout performed, IV checked, risks and benefits discussed and monitors and equipment checked  Epidural Patient position: sitting Prep: site prepped and draped and DuraPrep Patient monitoring: continuous pulse ox and blood pressure Approach: midline Injection technique: LOR air  Needle:  Needle type: Tuohy  Needle gauge: 17 G Needle length: 9 cm and 9 Needle insertion depth: 6 cm Catheter type: closed end flexible Catheter size: 19 Gauge Catheter at skin depth: 11 cm Test dose: negative and Other  Assessment Sensory level: T9 Events: blood not aspirated, injection not painful, no injection resistance, negative IV test and no paresthesia  Additional Notes Reason for block:procedure for pain

## 2013-06-23 NOTE — Progress Notes (Signed)
Called to see patient--still on [place], "feels hot", felt anxious with quick chest pain, now relieved with patient on right side.  Filed Vitals:   06/23/13 2332 06/23/13 2334 06/23/13 2339 06/23/13 2345  BP: 116/52     Pulse: 121 116 108 96  Temp:      TempSrc:      Resp: 22     Height:      Weight:      SpO2:  99% 100% 100%   Most recent temp 101.3 at 2256.  Will CTO--likely more anxiety-related, reports felt similar to how she felt with hypotensive episode during labor, "which scared me".  Patient reassured, given O2 for comfort, all VSS.  Will start Unasyn 3 gm IV q 6 hours x 2 doses. Tylenol/Ibuprophen soon.  Donnel Saxon, CNM 06/23/13 [phone]

## 2013-06-23 NOTE — Progress Notes (Signed)
Kimberly Holt is a 38 y.o. [phone] at [redacted]w[redacted]d admitted for active labor  Subjective:  Comfortable with epidural.  Objective:  BP 136/78  Pulse 105  Temp(Src) 98.1 F (36.7 C) (Oral)  Resp 20  Ht 5' 8.5" (1.74 m)  Wt 202 lb (91.627 kg)  BMI 30.26 kg/m2  LMP 09/24/2012      FHT:  Cat: 1 UC:   Strong SVE:   Dilation: 6 Effacement (%): 60 Station: -3 Exam by:: dr. Raphael Gibney  Membranes ruptured. Fluid was clear.  Labs:  Lab Results  Component Value Date   WBC 10.8* 06/23/2013   HGB 10.3* 06/23/2013   HCT 30.9* 06/23/2013   MCV 87.5 06/23/2013   PLT 185 06/23/2013    Assessment / Plan:  Protracted active phase  Labor: Will augment with Pitocin if necessary. Anticipated MOD:  NSVD  Kimberly Holt 06/23/2013, 6:27 PM

## 2013-06-23 NOTE — MAU Provider Note (Signed)
DATE: 06/23/2013  Maternity Admissions Unit History and Physical Exam for an Obstetrics Patient  [name] is a 38 y.o. female, [id number], at [redacted]w[redacted]d gestation, who presents for evaluation of his contractions. She has been followed at the [facility]. She denies bleeding and rupture membranes. She has had contractions off and on. They get stronger and then they get weaker. See history below.  OB History   Grav Para Term Preterm Abortions TAB SAB Ect Mult Living   4 1 1  2  2   1       Past Medical History  Diagnosis Date  . Right ureteral stone   . Constipation   . History of anemia   . Frequency of urination   . Urgency of urination   . Nocturia   . Hematuria   . Right flank pain   . History of kidney stones 11/03/2012  . Headache(784.0)   . Anemia   . Infection     UTI  . Anxiety   . Vaginal Pap smear, abnormal     rpt was WNL  HSV  Prescriptions prior to admission  Medication Sig Dispense Refill  . acetaminophen (TYLENOL) 500 MG tablet Take 500 mg by mouth every 6 (six) hours as needed for pain.       . Doxylamine-Pyridoxine (DICLEGIS PO) Take 2 tablets by mouth at bedtime.      . pantoprazole (PROTONIX) 40 MG tablet Take 1 tablet (40 mg total) by mouth daily.  30 tablet  4  . Prenatal Vit-Fe Fumarate-FA (PRENATAL MULTIVITAMIN) TABS tablet Take 1 tablet by mouth daily at 12 noon.      . valACYclovir (VALTREX) 500 MG tablet Take 500 mg by mouth daily.        Past Surgical History  Procedure Laterality Date  . Appendectomy  AGE 21  . Cystoscopy/retrograde/ureteroscopy  10/05/2011    Procedure: CYSTOSCOPY/RETROGRADE/URETEROSCOPY;  Surgeon: Hanley Ben, MD;  Location: [facility];  Service: Urology;  Laterality: Right;    Allergies  Allergen Reactions  . Codeine Nausea And Vomiting    SEVERE  . Hydrocodone Nausea And Vomiting    SEVERE    Family History: family history  includes Cancer in her maternal grandmother, paternal aunt, and paternal grandmother; Diabetes in her father; Heart disease in her paternal uncle.  Social History:  reports that she has never smoked. She has never used smokeless tobacco. She reports that she does not drink alcohol or use illicit drugs.  Review of systems: Normal pregnancy complaints.  Admission Physical Exam:  Dilation: 4.5 Effacement (%): 60 Station:  (high) Exam by:: jolynn There is no weight on file to calculate BMI.  Blood pressure 133/78, pulse 108, temperature 98.3 F (36.8 C), temperature source Oral, resp. rate 18, last menstrual period 09/24/2012.  HEENT:                 Within normal limits Chest:                   Clear Heart:                    Regular rate and rhythm Abdomen:             Gravid and nontender Extremities:          Grossly normal Neurologic exam: Grossly normal  Prenatal labs: ABO, Rh:  O+ Antibody:               Neg Rubella:                 Immune RPR:                      NR HBsAg:                   NR HIV:                         NR GBS:                      Neg  NST: Category category 1; Contractions: Mild .   Assessment:  [redacted]w[redacted]d gestation  Uterine contractions, questionable labor  Herpesvirus  Anxiety  Advanced maternal age (normal [place] screen)  Plan:  Allow the patient to walk. Reevaluate cervix in 2-3 hours.   Pinchas Reither V 06/23/2013, 11:59 AM

## 2013-06-24 ENCOUNTER — Encounter (HOSPITAL_COMMUNITY): Payer: Self-pay | Admitting: Family Medicine

## 2013-06-24 LAB — CBC
HCT: 24.1 % — ABNORMAL LOW (ref 36.0–46.0)
Hemoglobin: 8.1 g/dL — ABNORMAL LOW (ref 12.0–15.0)
MCH: 29.3 pg (ref 26.0–34.0)
MCHC: 33.6 g/dL (ref 30.0–36.0)
MCV: 87.3 fL (ref 78.0–100.0)
Platelets: 154 10*3/uL (ref 150–400)
RBC: 2.76 MIL/uL — ABNORMAL LOW (ref 3.87–5.11)
RDW: 14.7 % (ref 11.5–15.5)
WBC: 9.2 10*3/uL (ref 4.0–10.5)

## 2013-06-24 MED ORDER — PRENATAL MULTIVITAMIN CH
1.0000 | ORAL_TABLET | Freq: Every day | ORAL | Status: DC
  Administered 2013-06-24 – 2013-06-26 (×3): 1 via ORAL
  Filled 2013-06-24 (×3): qty 1

## 2013-06-24 MED ORDER — ZOLPIDEM TARTRATE 5 MG PO TABS: 5.0000 mg | ORAL_TABLET | Freq: Every evening | ORAL | Status: DC | PRN

## 2013-06-24 MED ORDER — IBUPROFEN 600 MG PO TABS
600.0000 mg | ORAL_TABLET | Freq: Four times a day (QID) | ORAL | Status: DC
  Administered 2013-06-24 – 2013-06-26 (×10): 600 mg via ORAL
  Filled 2013-06-24 (×10): qty 1

## 2013-06-24 MED ORDER — ONDANSETRON HCL 4 MG/2ML IJ SOLN: 4.0000 mg | INTRAMUSCULAR | Status: DC | PRN

## 2013-06-24 MED ORDER — LANOLIN HYDROUS EX OINT: TOPICAL_OINTMENT | CUTANEOUS | Status: DC | PRN

## 2013-06-24 MED ORDER — BENZOCAINE-MENTHOL 20-0.5 % EX AERO
1.0000 "application " | INHALATION_SPRAY | CUTANEOUS | Status: DC | PRN
  Filled 2013-06-24 (×3): qty 56

## 2013-06-24 MED ORDER — WITCH HAZEL-GLYCERIN EX PADS: 1.0000 "application " | MEDICATED_PAD | CUTANEOUS | Status: DC | PRN

## 2013-06-24 MED ORDER — DIPHENHYDRAMINE HCL 25 MG PO CAPS: 25.0000 mg | ORAL_CAPSULE | Freq: Four times a day (QID) | ORAL | Status: DC | PRN

## 2013-06-24 MED ORDER — SIMETHICONE 80 MG PO CHEW
80.0000 mg | CHEWABLE_TABLET | ORAL | Status: DC | PRN
  Administered 2013-06-26 (×2): 80 mg via ORAL
  Filled 2013-06-24 (×2): qty 1

## 2013-06-24 MED ORDER — ONDANSETRON HCL 4 MG PO TABS
4.0000 mg | ORAL_TABLET | ORAL | Status: DC | PRN
  Administered 2013-06-25: 4 mg via ORAL
  Filled 2013-06-24: qty 1

## 2013-06-24 MED ORDER — TETANUS-DIPHTH-ACELL PERTUSSIS 5-2.5-18.5 LF-MCG/0.5 IM SUSP: 0.5000 mL | Freq: Once | INTRAMUSCULAR | Status: DC

## 2013-06-24 MED ORDER — SENNOSIDES-DOCUSATE SODIUM 8.6-50 MG PO TABS
2.0000 | ORAL_TABLET | ORAL | Status: DC
  Administered 2013-06-25: 2 via ORAL
  Filled 2013-06-24: qty 2

## 2013-06-24 MED ORDER — OXYCODONE-ACETAMINOPHEN 5-325 MG PO TABS: 1.0000 | ORAL_TABLET | ORAL | Status: DC | PRN

## 2013-06-24 MED ORDER — DIBUCAINE 1 % RE OINT: 1.0000 "application " | TOPICAL_OINTMENT | RECTAL | Status: DC | PRN

## 2013-06-24 NOTE — Lactation Note (Signed)
This note was copied from the chart of [place]. Lactation Consultation Note Follow up visit at 22 hours, mom requesting latch assist.  Mom breastfeeding in cradle hold and complaining of nipple pain.  Baby appears to have good deep latch with wide flanged lips.  Encouraged mom to unlatch baby to assess nipple. Positional stripe noted on upper half of nipple.  Hand expression done with colostrum visible.  Mom reports not seeing her right breast change this pregnancy, but is encouraged to see colostrum.  Offered pillow support and re attempted latch.  Baby again appears to have good deep latch with wide flanged lips.  Mom complains of pain of 5 out of 10.  Attempted position changes, but pain did not improve. Mom to call for latch assist as needed.      Patient Name: Kimberly Holt HERDE'Y Date: 06/24/2013 Reason for consult: Follow-up assessment;Difficult latch;Breast/nipple pain   Maternal Data    Feeding Feeding Type: Breast Fed  LATCH Score/Interventions Latch: Grasps breast easily, tongue down, lips flanged, rhythmical sucking. Intervention(s): Adjust position;Assist with latch;Breast compression  Audible Swallowing: A few with stimulation Intervention(s): Skin to skin;Hand expression  Type of Nipple: Everted at rest and after stimulation  Comfort (Breast/Nipple): Filling, red/small blisters or bruises, mild/mod discomfort  Problem noted: Mild/Moderate discomfort Interventions (Mild/moderate discomfort): Hand expression  Hold (Positioning): Assistance needed to correctly position infant at breast and maintain latch. Intervention(s): Breastfeeding basics reviewed;Support Pillows;Position options;Skin to skin  LATCH Score: 7  Lactation Tools Discussed/Used     Consult Status Consult Status: Follow-up Date: 06/25/13 Follow-up type: In-patient    Justice Britain 06/24/2013, 8:30 PM

## 2013-06-24 NOTE — Lactation Note (Addendum)
This note was copied from the chart of [place]. Lactation Consultation Note Follow up consult:  Mother noticed feeding cues and called lactation to view latch.  Mother performed breast massage, LC demonstrated hand expression with teach back and a few drops of colostrum expressed.  Mother placed baby in cradle hold and baby latched briefly and fell asleep.  Reviewed waking techniques, undressed baby.  Attempted to latch baby in football position but baby enjoying a nap.  Mother states he normally nurses well.  Baby will be circumcised later today.  Reviewed what to expect this evening, sleepy baby at first and then cluster feeding.  Mother has a history of anemia and mastitis.  Mother is going discuss her prenatal vitamins with her physician.  Encouraged mother to put the baby to the breast often, at least 8-12 times a day and not going for long stretches at night without feeding.  Encouraged mother to call for further  assistance.   Patient Name: Kimberly Holt [id number] Date: 06/24/2013 Reason for consult: Follow-up assessment   Maternal Data Has patient been taught Hand Expression?: Yes Does the patient have breastfeeding experience prior to this delivery?: Yes  Feeding Feeding Type: Breast Fed  LATCH Score/Interventions Latch: Repeated attempts needed to sustain latch, nipple held in mouth throughout feeding, stimulation needed to elicit sucking reflex. Intervention(s): Adjust position (baby sleepy)  Audible Swallowing: None  Type of Nipple: Everted at rest and after stimulation  Comfort (Breast/Nipple): Soft / non-tender     Hold (Positioning): Assistance needed to correctly position infant at breast and maintain latch.  LATCH Score: 6  Lactation Tools Discussed/Used     Consult Status Consult Status: Follow-up Date: 06/24/13 Follow-up type: In-patient    [name] [facility] 06/24/2013, 2:25 PM

## 2013-06-24 NOTE — Anesthesia Postprocedure Evaluation (Signed)
  Anesthesia Post-op Note  Patient: Kimberly Holt  Procedure(s) Performed: * No procedures listed *  Patient Location: Mother/Baby  Anesthesia Type:Epidural  Level of Consciousness: awake  Airway and Oxygen Therapy: Patient Spontanous Breathing  Post-op Pain: none  Post-op Assessment: Patient's Cardiovascular Status Stable, Respiratory Function Stable, Patent Airway, No signs of Nausea or vomiting, Adequate PO intake, Pain level controlled, No headache, No backache, No residual numbness and No residual motor weakness  Post-op Vital Signs: Reviewed and stable  Complications: No apparent anesthesia complications

## 2013-06-24 NOTE — Lactation Note (Signed)
This note was copied from the chart of [place]. Lactation Consultation Note Initial consult:  Baby Kimberly 80 hours old and mother states breastfeeding going well.  Mother states she has tried hand expression and has "no milk".  Reviewed colostrum volume.  Baby dressed for photos so mother will call for [place] to assist with breastfeeding and hand expression.  Mother states she introduced formula with older child and at 5 weeks her milk supply diminished drastically although she continued to pump and had mastitis.  Mother states she is committed to exclusively breastfeed with this baby. Reviewed basics, supply and demand, cluster feeding, lactation support services and brochure.   Patient Name: Kimberly Holt [id number] Date: 06/24/2013 Reason for consult: Initial assessment   Maternal Data Has patient been taught Hand Expression?: Yes Does the patient have breastfeeding experience prior to this delivery?: Yes  Feeding Feeding Type: Breast Fed Length of feed: 16 min  LATCH Score/Interventions                      Lactation Tools Discussed/Used     Consult Status Consult Status: Follow-up Date: 06/24/13 Follow-up type: In-patient    [name] [facility] 06/24/2013, 1:30 PM

## 2013-06-24 NOTE — Progress Notes (Signed)
Post Partum Day 1 Subjective: no complaints  Objective: Blood pressure 117/77, pulse 108, temperature 98 F (36.7 C), temperature source Oral, resp. rate 18, height 5' 8.5" (1.74 m), weight 202 lb (91.627 kg), last menstrual period 09/24/2012, SpO2 98.00%, unknown if currently breastfeeding.  Physical Exam:  General: alert and cooperative Lochia: appropriate Uterine Fundus: firm Incision: na DVT Evaluation: No evidence of DVT seen on physical exam.   Recent Labs  06/23/13 1315 06/24/13 0540  HGB 10.3* 8.1*  HCT 30.9* 24.1*    Assessment/Plan: Plan for discharge tomorrow, Breastfeeding and Circumcision prior to discharge   LOS: 1 day   [place] A 06/24/2013, 2:05 PM

## 2013-06-25 DIAGNOSIS — R197 Diarrhea, unspecified: Secondary | ICD-10-CM | POA: Diagnosis not present

## 2013-06-25 LAB — CLOSTRIDIUM DIFFICILE BY PCR: Toxigenic C. Difficile by PCR: NEGATIVE

## 2013-06-25 MED ORDER — LOPERAMIDE HCL 2 MG PO CAPS
2.0000 mg | ORAL_CAPSULE | ORAL | Status: DC | PRN
  Administered 2013-06-25 – 2013-06-26 (×2): 2 mg via ORAL
  Filled 2013-06-25 (×2): qty 1

## 2013-06-25 MED ORDER — LACTATED RINGERS IV SOLN
INTRAVENOUS | Status: DC
  Administered 2013-06-25: 19:00:00 via INTRAVENOUS

## 2013-06-25 MED ORDER — LOPERAMIDE HCL 2 MG PO CAPS
4.0000 mg | ORAL_CAPSULE | Freq: Once | ORAL | Status: AC
  Administered 2013-06-25: 4 mg via ORAL
  Filled 2013-06-25: qty 2

## 2013-06-25 NOTE — Progress Notes (Signed)
Patient was referred for history of depression/anxiety. * Referral screened out by Clinical Social Worker because none of the following criteria appear to apply:  ~ History of anxiety/depression during this pregnancy, or of post-partum depression.  ~ Diagnosis of anxiety and/or depression within last 11 years, per chart review.  ~ History of depression due to pregnancy loss/loss of child  OR * Patient's symptoms currently being treated with medication and/or therapy.  Please contact the Clinical Social Worker if needs arise, or by the patient's request.

## 2013-06-25 NOTE — Progress Notes (Signed)
UR chart review completed.  

## 2013-06-25 NOTE — Discharge Summary (Signed)
Vaginal Delivery Discharge Summary  Kimberly Holt  DOB:    10/31/1975 MRN:    [id number] CSN:    [id number]  Date of admission:                  06/23/13  Date of discharge:                   06/26/13  Procedures this admission:  SVB, repair of 2nd degree perineal, ATB treatment for chorioamnionits  Date of Delivery: 06/23/13  Newborn Data:  Live born female  Birth Weight: 8 lb 12.7 oz (3989 g) APGAR: 9, 9  Home with mother. Name: Kimberly Holt Circumcision Plan: Inpatient  History of Present Illness:  [name] is a 38 y.o. female, [phone], who presents at [redacted]w[redacted]d weeks gestation. The patient has been followed at the [facility] division of [facility]. She was admitted onset of labor. Her pregnancy has been complicated by:  Patient Active Problem List   Diagnosis Date Noted  . Diarrhea 06/25/2013  . Vaginal delivery 06/23/2013  . Second-degree perineal laceration, with delivery 06/23/2013  . Hyperemesis 01/29/2013  . History of kidney stones 11/03/2012    Hospital course:  The patient was admitted for early labor.  She had epidural analgesia placed with benefit.  Her labor was not complicated, and her delivery was also uncomplicated, but she did demonstrate a fever immediately pp to 101.3--she was placed on Unasyn 3 gm IV x 2 doses.. She did have an episode of apparent panic-type behavior just after delivery, with no sequellae.  Her postpartum course was complicated by profuse diarrhea on day 2--C diff culture was negative, and the patient was placed on Immodium and received IV hydration with benefit.  She had originally planned a BTL, but due to early pp fever, it was deferred until 6 weeks pp.  By day 3 pp, she was feeling better, with minimal loose stools and she was discharged to home on postpartum day 3 doing well.  Feeding:  breast  Contraception:  bilateral tubal ligation at 6 weeks  Discharge  hemoglobin:  Hemoglobin  Date Value Ref Range Status  06/24/2013 8.1* 12.0 - 15.0 g/dL Final     DELTA CHECK NOTED     REPEATED TO VERIFY     HCT  Date Value Ref Range Status  06/24/2013 24.1* 36.0 - 46.0 % Final    Discharge Physical Exam:   General: alert Lochia: appropriate Uterine Fundus: firm Incision: healing well DVT Evaluation: No evidence of DVT seen on physical exam. Negative Homan's sign.  Intrapartum Procedures: spontaneous vaginal delivery Postpartum Procedures: antibiotics Complications-Operative and Postpartum: 2nd degree perineal laceration  Discharge Diagnoses: Term Pregnancy-delivered and chorioamnionitis, pp diarrhea  Discharge Information:  Activity:           Per [facility] handout Diet:                routine Medications: Ibuprofen, Iron and Immodium Condition:      stable Instructions:   Postpartum Care After Vaginal Delivery  After you deliver your newborn (postpartum period), the usual stay in the hospital is 24 72 hours. If there were problems with your labor or delivery, or if you have other medical problems, you might be in the hospital longer.  While you are in the hospital, you will receive help and instructions on how to care for yourself and your newborn during the postpartum period.  While you are in the hospital:  Be  sure to tell your nurses if you have pain or discomfort, as well as where you feel the pain and what makes the pain worse.  If you had an incision made near your vagina (episiotomy) or if you had some tearing during delivery, the nurses may put ice packs on your episiotomy or tear. The ice packs may help to reduce the pain and swelling.  If you are breastfeeding, you may feel uncomfortable contractions of your uterus for a couple of weeks. This is normal. The contractions help your uterus get back to normal size.  It is normal to have some bleeding after delivery.  For the first 1 3 days after delivery, the flow is red and the  amount may be similar to a period.  It is common for the flow to start and stop.  In the first few days, you may pass some small clots. Let your nurses know if you begin to pass large clots or your flow increases.  Do not  flush blood clots down the toilet before having the nurse look at them.  During the next 3 10 days after delivery, your flow should become more watery and pink or brown-tinged in color.  Ten to fourteen days after delivery, your flow should be a small amount of yellowish-white discharge.  The amount of your flow will decrease over the first few weeks after delivery. Your flow may stop in 6 8 weeks. Most women have had their flow stop by 12 weeks after delivery.  You should change your sanitary pads frequently.  Wash your hands thoroughly with soap and water for at least 20 seconds after changing pads, using the toilet, or before holding or feeding your newborn.  You should feel like you need to empty your bladder within the first 6 8 hours after delivery.  In case you become weak, lightheaded, or faint, call your nurse before you get out of bed for the first time and before you take a shower for the first time.  Within the first few days after delivery, your breasts may begin to feel tender and full. This is called engorgement. Breast tenderness usually goes away within 48 72 hours after engorgement occurs. You may also notice milk leaking from your breasts. If you are not breastfeeding, do not stimulate your breasts. Breast stimulation can make your breasts produce more milk.  Spending as much time as possible with your newborn is very important. During this time, you and your newborn can feel close and get to know each other. Having your newborn stay in your room (rooming in) will help to strengthen the bond with your newborn. It will give you time to get to know your newborn and become comfortable caring for your newborn.  Your hormones change after delivery.  Sometimes the hormone changes can temporarily cause you to feel sad or tearful. These feelings should not last more than a few days. If these feelings last longer than that, you should talk to your caregiver.  If desired, talk to your caregiver about methods of family planning or contraception.  Talk to your caregiver about immunizations. Your caregiver may want you to have the following immunizations before leaving the hospital:  Tetanus, diphtheria, and pertussis (Tdap) or tetanus and diphtheria (Td) immunization. It is very important that you and your family (including grandparents) or others caring for your newborn are up-to-date with the Tdap or Td immunizations. The Tdap or Td immunization can help protect your newborn from getting ill.  Rubella  immunization.  Varicella (chickenpox) immunization.  Influenza immunization. You should receive this annual immunization if you did not receive the immunization during your pregnancy. Document Released: 02/25/2007 Document Revised: 01/23/2012 Document Reviewed: 12/26/2011 [facility] Patient Information 2014 [place].   Postpartum Depression and Baby Blues  The postpartum period begins right after the birth of a baby. During this time, there is often a great amount of joy and excitement. It is also a time of considerable changes in the life of the parent(s). Regardless of how many times a mother gives birth, each child brings new challenges and dynamics to the family. It is not unusual to have feelings of excitement accompanied by confusing shifts in moods, emotions, and thoughts. All mothers are at risk of developing postpartum depression or the "baby blues." These mood changes can occur right after giving birth, or they may occur many months after giving birth. The baby blues or postpartum depression can be mild or severe. Additionally, postpartum depression can resolve rather quickly, or it can be a long-term condition. CAUSES Elevated  hormones and their rapid decline are thought to be a main cause of postpartum depression and the baby blues. There are a number of hormones that radically change during and after pregnancy. Estrogen and progesterone usually decrease immediately after delivering your baby. The level of thyroid hormone and various cortisol steroids also rapidly drop. Other factors that play a major role in these changes include major life events and genetics.  RISK FACTORS If you have any of the following risks for the baby blues or postpartum depression, know what symptoms to watch out for during the postpartum period. Risk factors that may increase the likelihood of getting the baby blues or postpartum depression include:  Havinga personal or family history of depression.  Having depression while being pregnant.  Having premenstrual or oral contraceptive-associated mood issues.  Having exceptional life stress.  Having marital conflict.  Lacking a social support network.  Having a baby with special needs.  Having health problems such as diabetes. SYMPTOMS Baby blues symptoms include:  Brief fluctuations in mood, such as going from extreme happiness to sadness.  Decreased concentration.  Difficulty sleeping.  Crying spells, tearfulness.  Irritability.  Anxiety. Postpartum depression symptoms typically begin within the first month after giving birth. These symptoms include:  Difficulty sleeping or excessive sleepiness.  Marked weight loss.  Agitation.  Feelings of worthlessness.  Lack of interest in activity or food. Postpartum psychosis is a very concerning condition and can be dangerous. Fortunately, it is rare. Displaying any of the following symptoms is cause for immediate medical attention. Postpartum psychosis symptoms include:  Hallucinations and delusions.  Bizarre or disorganized behavior.  Confusion or disorientation. DIAGNOSIS  A diagnosis is made by an evaluation of your  symptoms. There are no medical or lab tests that lead to a diagnosis, but there are various questionnaires that a caregiver may use to identify those with the baby blues, postpartum depression, or psychosis. Often times, a screening tool called the [place] Postnatal Depression Scale is used to diagnose depression in the postpartum period.  TREATMENT The baby blues usually goes away on its own in 1 to 2 weeks. Social support is often all that is needed. You should be encouraged to get adequate sleep and rest. Occasionally, you may be given medicines to help you sleep.  Postpartum depression requires treatment as it can last several months or longer if it is not treated. Treatment may include individual or group therapy, medicine, or both  to address any social, physiological, and psychological factors that may play a role in the depression. Regular exercise, a healthy diet, rest, and social support may also be strongly recommended.  Postpartum psychosis is more serious and needs treatment right away. Hospitalization is often needed. HOME CARE INSTRUCTIONS  Get as much rest as you can. Nap when the baby sleeps.  Exercise regularly. Some women find yoga and walking to be beneficial.  Eat a balanced and nourishing diet.  Do little things that you enjoy. Have a cup of tea, take a bubble bath, read your favorite magazine, or listen to your favorite music.  Avoid alcohol.  Ask for help with household chores, cooking, grocery shopping, or running errands as needed. Do not try to do everything.  Talk to people close to you about how you are feeling. Get support from your partner, family members, friends, or other new moms.  Try to stay positive in how you think. Think about the things you are grateful for.  Do not spend a lot of time alone.  Only take medicine as directed by your caregiver.  Keep all your postpartum appointments.  Let your caregiver know if you have any concerns. SEEK MEDICAL  CARE IF: You are having a reaction or problems with your medicine. SEEK IMMEDIATE MEDICAL CARE IF:  You have suicidal feelings.  You feel you may harm the baby or someone else. Document Released: 02/02/2004 Document Revised: 07/23/2011 Document Reviewed: 03/06/2011 [facility] Patient Information 2014 [place], [place].   Discharge to: home     Donnel Saxon 06/26/2013

## 2013-06-25 NOTE — Progress Notes (Addendum)
Post Partum Day 2  Subjective: up ad lib, voiding, tolerating PO, + flatus and has bad diarrhea.  Watery and has gone 27 times since about 6p last night she said.  Upon entering room, pt had mistake on floor.  Objective: Blood pressure 115/76, pulse 108, temperature 98.8 F (37.1 C), temperature source Oral, resp. rate 18, height 5' 8.5" (1.74 m), weight 91.627 kg (202 lb), last menstrual period 09/24/2012, SpO2 98.00%, unknown if currently breastfeeding.  Physical Exam:  General: alert and no distress Lochia: appropriate Uterine Fundus: firm DVT Evaluation: No evidence of DVT seen on physical exam.   Recent Labs  06/23/13 1315 06/24/13 0540  HGB 10.3* 8.1*  HCT 30.9* 24.1*    Assessment/Plan: Contraception plans sterilization procedure after 6wk pp visit. Initially not done because pt had a fever. Breast feeding as much as she can while feeling ill IVF restarted C-diff Continue to observe Afebrile > 24hrs   LOS: 2 days   Jasmyne Lodato Y 06/25/2013, 12:20 PM

## 2013-06-25 NOTE — Lactation Note (Addendum)
This note was copied from the chart of [place]. Lactation Consultation Note  Patient Name: Kimberly Holt Date: 06/25/2013  Follow-up appointment with mom. Baby 22 hours old. Mom reports baby in nursery, mom has diarrhea, being tested for c-diff. Enc mom to pump for 15 minutes every 3 hours while baby in nursery, and send any colostrum to nursery for baby. Mom requests that baby not be fed with nipple/bottle. Mom request syringe feeding for baby until she feels well enough to feed baby herself. Mom has her own Medela pump in the room and will use this.    Maternal Data    Feeding    [facility] Score/Interventions                      Lactation Tools Discussed/Used     Consult Status      Inocente Salles 06/25/2013, 6:06 PM

## 2013-06-26 ENCOUNTER — Inpatient Hospital Stay (HOSPITAL_COMMUNITY)
Admission: AD | Admit: 2013-06-26 | Discharge: 2013-06-28 | DRG: 776 | Disposition: A | Discharge status: Home / Self Care | Payer: BC Managed Care – PPO | Source: Ambulatory Visit | Attending: Obstetrics and Gynecology | Admitting: Obstetrics and Gynecology

## 2013-06-26 ENCOUNTER — Encounter (HOSPITAL_COMMUNITY): Payer: Self-pay | Admitting: *Deleted

## 2013-06-26 DIAGNOSIS — E876 Hypokalemia: Secondary | ICD-10-CM | POA: Diagnosis present

## 2013-06-26 DIAGNOSIS — O99345 Other mental disorders complicating the puerperium: Secondary | ICD-10-CM | POA: Diagnosis present

## 2013-06-26 DIAGNOSIS — O411290 Chorioamnionitis, unspecified trimester, not applicable or unspecified: Secondary | ICD-10-CM | POA: Diagnosis not present

## 2013-06-26 DIAGNOSIS — R000 Tachycardia, unspecified: Secondary | ICD-10-CM | POA: Diagnosis present

## 2013-06-26 DIAGNOSIS — O9279 Other disorders of lactation: Secondary | ICD-10-CM | POA: Diagnosis present

## 2013-06-26 DIAGNOSIS — D649 Anemia, unspecified: Secondary | ICD-10-CM | POA: Diagnosis present

## 2013-06-26 DIAGNOSIS — O864 Pyrexia of unknown origin following delivery: Secondary | ICD-10-CM | POA: Diagnosis present

## 2013-06-26 DIAGNOSIS — O8612 Endometritis following delivery: Principal | ICD-10-CM | POA: Diagnosis present

## 2013-06-26 DIAGNOSIS — F411 Generalized anxiety disorder: Secondary | ICD-10-CM | POA: Diagnosis present

## 2013-06-26 DIAGNOSIS — O9081 Anemia of the puerperium: Secondary | ICD-10-CM | POA: Diagnosis present

## 2013-06-26 LAB — URINALYSIS, ROUTINE W REFLEX MICROSCOPIC
BILIRUBIN URINE: NEGATIVE
Glucose, UA: NEGATIVE mg/dL
Hgb urine dipstick: NEGATIVE
Ketones, ur: 15 mg/dL — AB
Leukocytes, UA: NEGATIVE
NITRITE: NEGATIVE
Protein, ur: NEGATIVE mg/dL
SPECIFIC GRAVITY, URINE: 1.02 (ref 1.005–1.030)
Urobilinogen, UA: 0.2 mg/dL (ref 0.0–1.0)
pH: 6 (ref 5.0–8.0)

## 2013-06-26 LAB — COMPREHENSIVE METABOLIC PANEL
ALT: 62 U/L — ABNORMAL HIGH (ref 0–35)
AST: 131 U/L — AB (ref 0–37)
Albumin: 2.1 g/dL — ABNORMAL LOW (ref 3.5–5.2)
Alkaline Phosphatase: 125 U/L — ABNORMAL HIGH (ref 39–117)
BUN: 13 mg/dL (ref 6–23)
CHLORIDE: 105 meq/L (ref 96–112)
CO2: 20 mEq/L (ref 19–32)
Calcium: 8.4 mg/dL (ref 8.4–10.5)
Creatinine, Ser: 0.68 mg/dL (ref 0.50–1.10)
GFR calc Af Amer: >90 mL/min (ref >90)
GFR calc non Af Amer: >90 mL/min (ref >90)
Glucose, Bld: 79 mg/dL (ref 70–99)
Potassium: 3.3 mEq/L — ABNORMAL LOW (ref 3.7–5.3)
Sodium: 138 mEq/L (ref 137–147)
TOTAL PROTEIN: 4.9 g/dL — AB (ref 6.0–8.3)
Total Bilirubin: 0.3 mg/dL (ref 0.3–1.2)

## 2013-06-26 LAB — CBC WITH DIFFERENTIAL/PLATELET
BASOS ABS: 0 10*3/uL (ref 0.0–0.1)
BASOS PCT: 0 % (ref 0–1)
EOS ABS: 0.1 10*3/uL (ref 0.0–0.7)
Eosinophils Relative: 1 % (ref 0–5)
HEMATOCRIT: 24 % — AB (ref 36.0–46.0)
Hemoglobin: 8 g/dL — ABNORMAL LOW (ref 12.0–15.0)
Lymphocytes Relative: 7 % — ABNORMAL LOW (ref 12–46)
Lymphs Abs: 0.7 10*3/uL (ref 0.7–4.0)
MCH: 30 pg (ref 26.0–34.0)
MCHC: 33.3 g/dL (ref 30.0–36.0)
MCV: 89.9 fL (ref 78.0–100.0)
MONO ABS: 0.4 10*3/uL (ref 0.1–1.0)
Monocytes Relative: 4 % (ref 3–12)
NEUTROS ABS: 8.8 10*3/uL — AB (ref 1.7–7.7)
Neutrophils Relative %: 88 % — ABNORMAL HIGH (ref 43–77)
Platelets: 226 10*3/uL (ref 150–400)
RBC: 2.67 MIL/uL — ABNORMAL LOW (ref 3.87–5.11)
RDW: 14.9 % (ref 11.5–15.5)
WBC: 10 10*3/uL (ref 4.0–10.5)

## 2013-06-26 LAB — AMYLASE: Amylase: 49 U/L (ref 0–105)

## 2013-06-26 LAB — LIPASE, BLOOD: Lipase: 13 U/L (ref 11–59)

## 2013-06-26 MED ORDER — OXYCODONE-ACETAMINOPHEN 5-325 MG PO TABS: 1.0000 | ORAL_TABLET | ORAL | Status: DC | PRN

## 2013-06-26 MED ORDER — LACTATED RINGERS IV BOLUS (SEPSIS)
500.0000 mL | Freq: Once | INTRAVENOUS | Status: AC
  Administered 2013-06-26: 500 mL via INTRAVENOUS

## 2013-06-26 MED ORDER — PRENATAL MULTIVITAMIN CH
1.0000 | ORAL_TABLET | Freq: Every day | ORAL | Status: DC
  Administered 2013-06-27: 1 via ORAL
  Filled 2013-06-26: qty 1

## 2013-06-26 MED ORDER — LOPERAMIDE HCL 2 MG PO CAPS: 2.0000 mg | ORAL_CAPSULE | ORAL | Status: DC | PRN

## 2013-06-26 MED ORDER — ONDANSETRON HCL 4 MG/2ML IJ SOLN
4.0000 mg | Freq: Four times a day (QID) | INTRAMUSCULAR | Status: DC | PRN
Start: 2013-06-26 — End: 2013-06-28

## 2013-06-26 MED ORDER — KCL IN DEXTROSE-NACL 20-5-0.45 MEQ/L-%-% IV SOLN
INTRAVENOUS | Status: DC
  Administered 2013-06-26: 125 mL via INTRAVENOUS
  Administered 2013-06-27 – 2013-06-28 (×3): via INTRAVENOUS
  Filled 2013-06-26 (×6): qty 1000

## 2013-06-26 MED ORDER — IBUPROFEN 600 MG PO TABS
600.0000 mg | ORAL_TABLET | Freq: Four times a day (QID) | ORAL | Status: DC | PRN
  Administered 2013-06-27 – 2013-06-28 (×4): 600 mg via ORAL
  Filled 2013-06-26 (×4): qty 1

## 2013-06-26 MED ORDER — SODIUM CHLORIDE 0.9 % IV SOLN
3.0000 g | Freq: Four times a day (QID) | INTRAVENOUS | Status: DC
  Administered 2013-06-26 – 2013-06-28 (×7): 3 g via INTRAVENOUS
  Filled 2013-06-26 (×8): qty 3

## 2013-06-26 MED ORDER — IBUPROFEN 600 MG PO TABS: 600.0000 mg | ORAL_TABLET | Freq: Four times a day (QID) | ORAL | Status: DC | PRN

## 2013-06-26 MED ORDER — ONDANSETRON HCL 4 MG PO TABS: 4.0000 mg | ORAL_TABLET | Freq: Four times a day (QID) | ORAL | Status: DC | PRN

## 2013-06-26 MED ORDER — ACETAMINOPHEN 325 MG PO TABS: 650.0000 mg | ORAL_TABLET | Freq: Four times a day (QID) | ORAL | Status: DC | PRN

## 2013-06-26 MED ORDER — LACTATED RINGERS IV SOLN: INTRAVENOUS | Status: DC

## 2013-06-26 MED ORDER — SIMETHICONE 80 MG PO CHEW: 80.0000 mg | CHEWABLE_TABLET | Freq: Four times a day (QID) | ORAL | Status: DC | PRN

## 2013-06-26 MED ORDER — ZOLPIDEM TARTRATE 5 MG PO TABS: 5.0000 mg | ORAL_TABLET | Freq: Every evening | ORAL | Status: DC | PRN

## 2013-06-26 NOTE — MAU Provider Note (Signed)
History   38 yo [id number] s/p SVB on 06/23/13 presented with onset of fever and chills today around 5p.  Delivered on 2/10 by V. Cira Servant, CNM, with fever to 100 just prior to delivery and 101.3 30 min after delivery.  She had a 2nd degree perineal laceration.  She received 2 doses Unasyn 3 gm q 6 hours post-delivery, with no further temp elevations.  She had frequent diarrhea in the hours from 6pm on 2/11 through 1am today, then resolved.  C.diff culture was negative, WBC count was 9.2 post delivery, with Hgb 8.1 on day 1 (down from 10.3).  She has been breastfeeding and pumping, but denies any s/s of mastitis, but is currently engorged.  Denies chest pain, abdominal pain, urinary sx, CVAT, upper abdominal pain, URI sx, leg pain/swelling, or signficant perineal pain.  Does feel "gassy and bloated", with frequent flatus, but denies N/V or any further diarrhea or BM today.  She is having sporadic chills.  Reports her bleeding is minimal and denies significant cramping.  Denies leg pain or swelling.  Patient appears to feel very bad, but is arousable and oriented.  She is anxious regarding this situation and feels slightly SOB "due to my anxiety".    Last took Ibuprophen at 6:30p.  Placenta WNL on path exam.  GBS negative Was consistently tachycardic during labor and pp phase.  Patient Active Problem List   Diagnosis Date Noted  . Generalized anxiety disorder 06/26/2013  . Chorioamnionitis 06/26/2013  . Diarrhea 06/25/2013  . Vaginal delivery 06/23/2013  . Second-degree perineal laceration, with delivery 06/23/2013  . Hyperemesis 01/29/2013  . History of kidney stones 11/03/2012     Chief Complaint  Patient presents with  . Fever  . post partum    HPI:  See above  OB History   Grav Para Term Preterm Abortions TAB SAB Ect Mult Living   4 2 2  2  2   2       Past Medical History  Diagnosis Date  . Right ureteral stone   . Constipation   . History of anemia   . Frequency of urination    . Urgency of urination   . Nocturia   . Hematuria   . Right flank pain   . History of kidney stones 11/03/2012  . Headache(784.0)   . Anemia   . Infection     UTI  . Anxiety   . Vaginal Pap smear, abnormal     rpt was WNL    Past Surgical History  Procedure Laterality Date  . Appendectomy  AGE 38  . Cystoscopy/retrograde/ureteroscopy  10/05/2011    Procedure: CYSTOSCOPY/RETROGRADE/URETEROSCOPY;  Surgeon: Hanley Ben, MD;  Location: [facility];  Service: Urology;  Laterality: Right;    Family History  Problem Relation Age of Onset  . Cancer Paternal Grandmother   . Cancer Maternal Grandmother   . Diabetes Father   . Cancer Paternal Aunt   . Heart disease Paternal Uncle     History  Substance Use Topics  . Smoking status: Never Smoker   . Smokeless tobacco: Never Used  . Alcohol Use: No    Allergies:  Allergies  Allergen Reactions  . Codeine Nausea And Vomiting    SEVERE  . Hydrocodone Nausea And Vomiting    SEVERE    Prescriptions prior to admission  Medication Sig Dispense Refill  . Doxylamine-Pyridoxine (DICLEGIS PO) Take 2 tablets by mouth at bedtime.      Marland Kitchen ibuprofen (ADVIL,MOTRIN) 600 MG  tablet Take 1 tablet (600 mg total) by mouth every 6 (six) hours as needed.  30 tablet  2  . loperamide (IMODIUM) 2 MG capsule Take 1 capsule (2 mg total) by mouth as needed for diarrhea or loose stools (with eac new stool).  30 capsule  0  . Prenatal Vit-Fe Fumarate-FA (PRENATAL MULTIVITAMIN) TABS tablet Take 1 tablet by mouth daily at 12 noon.      . Simethicone (GAS-X PO) Take 1 tablet by mouth as needed (stomach cramp).      . valACYclovir (VALTREX) 500 MG tablet Take 500 mg by mouth daily.        ROS:  Fever, chills, breast engorgement, small amount lochia Physical Exam   Blood pressure 113/59, pulse 124, temperature 100.6 F (38.1 C), temperature source Oral, resp. rate 18, height 5' 8.5" (1.74 m), weight 202 lb (91.627 kg), last menstrual  period 09/24/2012, unknown if currently breastfeeding.  Physical Exam Having shaking chills at intervals--appears ill. Chest clear Heart RRR without murmur, but tachycardia Breasts--full, engorged, with scattered red areas over prominent ductal tissue, but NT Abd--gaseous distension noted in upper abdomen, but bowel sounds present in all quadrants.  No rebound or guarding. Fundus firm, NT, approx 14-16 week size. Lochia scant Perineum--2nd degree perineal laceration healing well, NT Ext DTR 2+, no clonus, trace edema, negative Homan's  ED Course  3 day s/p SVB, with fever/chills Presumptive chorioamnionitis at delivery Breastfeeding Recent diarrhea  Plan: IV hydration CBC, diff, CMP, blood cultures x 2 Cath UA Urine culture Pump breasts to relieve engorgement Support and reassurance to patient. Consulted with Dr. Shanon Ace await CBC/diff to determine need for ATB vs viral syndrome.   Kimberly Holt CNM, [place] 06/26/2013 8:35 PM  Addendum:  LC in to see patient and assist with engorgement and use of nipple shield--no s/s mastitis at present.  Filed Vitals:   06/26/13 1946 06/26/13 2126  BP: 113/59 121/70  Pulse: 124 123  Temp: 100.6 F (38.1 C) 99.4 F (37.4 C)  TempSrc: Oral Oral  Resp: 18 20  Height: 5' 8.5" (1.74 m)   Weight: 202 lb (91.627 kg)   SpO2:  95%    Results for orders placed during the hospital encounter of 06/26/13 (from the past 24 hour(s))  CBC WITH DIFFERENTIAL     Status: Abnormal   Collection Time    06/26/13  8:20 PM      Result Value Ref Range   WBC 10.0  4.0 - 10.5 K/uL   RBC 2.67 (*) 3.87 - 5.11 MIL/uL   Hemoglobin 8.0 (*) 12.0 - 15.0 g/dL   HCT 24.0 (*) 36.0 - 46.0 %   MCV 89.9  78.0 - 100.0 fL   MCH 30.0  26.0 - 34.0 pg   MCHC 33.3  30.0 - 36.0 g/dL   RDW 14.9  11.5 - 15.5 %   Platelets 226  150 - 400 K/uL   Neutrophils Relative % 88 (*) 43 - 77 %   Neutro Abs 8.8 (*) 1.7 - 7.7 K/uL   Lymphocytes Relative 7 (*) 12 - 46 %    Lymphs Abs 0.7  0.7 - 4.0 K/uL   Monocytes Relative 4  3 - 12 %   Monocytes Absolute 0.4  0.1 - 1.0 K/uL   Eosinophils Relative 1  0 - 5 %   Eosinophils Absolute 0.1  0.0 - 0.7 K/uL   Basophils Relative 0  0 - 1 %   Basophils Absolute 0.0  0.0 - 0.1  K/uL  COMPREHENSIVE METABOLIC PANEL     Status: Abnormal   Collection Time    06/26/13  8:20 PM      Result Value Ref Range   Sodium 138  137 - 147 mEq/L   Potassium 3.3 (*) 3.7 - 5.3 mEq/L   Chloride 105  96 - 112 mEq/L   CO2 20  19 - 32 mEq/L   Glucose, Bld 79  70 - 99 mg/dL   BUN 13  6 - 23 mg/dL   Creatinine, Ser 0.68  0.50 - 1.10 mg/dL   Calcium 8.4  8.4 - 10.5 mg/dL   Total Protein 4.9 (*) 6.0 - 8.3 g/dL   Albumin 2.1 (*) 3.5 - 5.2 g/dL   AST 131 (*) 0 - 37 U/L   ALT 62 (*) 0 - 35 U/L   Alkaline Phosphatase 125 (*) 39 - 117 U/L   Total Bilirubin 0.3  0.3 - 1.2 mg/dL   GFR calc non Af Amer >90  >90 mL/min   GFR calc Af Amer >90  >90 mL/min  URINALYSIS, ROUTINE W REFLEX MICROSCOPIC     Status: Abnormal   Collection Time    06/26/13  8:30 PM      Result Value Ref Range   Color, Urine YELLOW  YELLOW   APPearance CLEAR  CLEAR   Specific Gravity, Urine 1.020  1.005 - 1.030   pH 6.0  5.0 - 8.0   Glucose, UA NEGATIVE  NEGATIVE mg/dL   Hgb urine dipstick NEGATIVE  NEGATIVE   Bilirubin Urine NEGATIVE  NEGATIVE   Ketones, ur 15 (*) NEGATIVE mg/dL   Protein, ur NEGATIVE  NEGATIVE mg/dL   Urobilinogen, UA 0.2  0.0 - 1.0 mg/dL   Nitrite NEGATIVE  NEGATIVE   Leukocytes, UA NEGATIVE  NEGATIVE   Assessment: PP fever, hx chorioamnionitis during delivery Elevated AST/ALT Mild leukocytosis Hypokalemia Tachycardia Breastfeeding, with engorgement  Plan: Admitted to [facility] per consult with Dr. Cletis Media IV hydration, with D51/2NS with 20 meq KCL Unasyn 3 gm IV q 6 hours Repeat CBC, diff, CMP in am May continue to breastfeed/pump prn. Support to patient for status. Tylenol prn fever. D/C criteria to include afebrile x 24  hours.  Kimberly Holt, CNM 06/26/13 9:50p

## 2013-06-26 NOTE — MAU Note (Signed)
Patient presents to MAU with c/o of fever today, 101.4 taken at home.Reports chills. Postpartum; dc'd yesterday. Denies pain at this time.

## 2013-06-26 NOTE — MAU Note (Signed)
Report given to nurse on women's unit. Will go to room 304

## 2013-06-26 NOTE — Lactation Note (Signed)
Lactation Consultation Note Follow up consult in MAU:  Mother discharged today and back in MAU breasts engorged with fever.  Mother has ice on breasts.  Mother using #20 nipple shield, requested shield, she left hers at home.  Assisted mother to latch baby in cross cradle hold with nipple shield.  Baby latched easily, rhythmical sucks, swallows observed.  Reviewed  engorgement care with patient and nurse and provided information sheet.  Encouraged mother to feed baby every 2-3 hours and not go long stretches without feeding.  Mother has history of mastitis.  Reviewed plan with [name].   Patient Name: Kimberly Holt [id number] Date: 06/26/2013     Maternal Data    Feeding    LATCH Score/Interventions                      Lactation Tools Discussed/Used     Consult Status      [name] [facility] 06/26/2013, 9:15 PM

## 2013-06-26 NOTE — MAU Note (Signed)
Women's Unit called to give report. No nurses available to take report. Will await call back

## 2013-06-26 NOTE — H&P (Signed)
38 yo [id number] s/p SVB on 06/23/13 presented with onset of fever and chills today around 5p.  Delivered on 2/10 by V. Cira Servant, CNM, with fever to 100 just prior to delivery and 101.3 30 min after delivery.  She had a 2nd degree perineal laceration.  She received 2 doses Unasyn 3 gm q 6 hours post-delivery, with no further temp elevations.  She had frequent diarrhea in the hours from 6pm on 2/11 through 1am today, then resolved.  C.diff culture was negative, WBC count was 9.2 post delivery, with Hgb 8.1 on day 1 (down from 10.3).  She has been breastfeeding and pumping, but denies any s/s of mastitis, but is currently engorged.  Denies chest pain, abdominal pain, urinary sx, CVAT, upper abdominal pain, URI sx, leg pain/swelling, or signficant perineal pain.  Does feel "gassy and bloated", with frequent flatus, but denies N/V or any further diarrhea or BM today.  She is having sporadic chills.  Reports her bleeding is minimal and denies significant cramping.  Denies leg pain or swelling.  Patient appears to feel very bad, but is arousable and oriented.  She is anxious regarding this situation and feels slightly SOB "due to my anxiety".    Last took Ibuprophen at 6:30p.  Placenta WNL on path exam.  GBS negative Was consistently tachycardic during labor and pp phase.  Patient Active Problem List   Diagnosis Date Noted  . Generalized anxiety disorder 06/26/2013  . Chorioamnionitis 06/26/2013  . Diarrhea 06/25/2013  . Vaginal delivery 06/23/2013  . Second-degree perineal laceration, with delivery 06/23/2013  . Hyperemesis 01/29/2013  . History of kidney stones 11/03/2012     Chief Complaint  Patient presents with  . Fever  . post partum    HPI:  See above  OB History   Grav Para Term Preterm Abortions TAB SAB Ect Mult Living   4 2 2  2  2   2       Past Medical History  Diagnosis Date  . Right ureteral stone   . Constipation   . History of anemia   . Frequency of urination   . Urgency  of urination   . Nocturia   . Hematuria   . Right flank pain   . History of kidney stones 11/03/2012  . Headache(784.0)   . Anemia   . Infection     UTI  . Anxiety   . Vaginal Pap smear, abnormal     rpt was WNL    Past Surgical History  Procedure Laterality Date  . Appendectomy  AGE 71  . Cystoscopy/retrograde/ureteroscopy  10/05/2011    Procedure: CYSTOSCOPY/RETROGRADE/URETEROSCOPY;  Surgeon: Hanley Ben, MD;  Location: [facility];  Service: Urology;  Laterality: Right;    Family History  Problem Relation Age of Onset  . Cancer Paternal Grandmother   . Cancer Maternal Grandmother   . Diabetes Father   . Cancer Paternal Aunt   . Heart disease Paternal Uncle     History  Substance Use Topics  . Smoking status: Never Smoker   . Smokeless tobacco: Never Used  . Alcohol Use: No    Allergies:  Allergies  Allergen Reactions  . Codeine Nausea And Vomiting    SEVERE  . Hydrocodone Nausea And Vomiting    SEVERE    Prescriptions prior to admission  Medication Sig Dispense Refill  . Doxylamine-Pyridoxine (DICLEGIS PO) Take 2 tablets by mouth at bedtime.      Marland Kitchen ibuprofen (ADVIL,MOTRIN) 600 MG tablet Take 1  tablet (600 mg total) by mouth every 6 (six) hours as needed.  30 tablet  2  . loperamide (IMODIUM) 2 MG capsule Take 1 capsule (2 mg total) by mouth as needed for diarrhea or loose stools (with eac new stool).  30 capsule  0  . Prenatal Vit-Fe Fumarate-FA (PRENATAL MULTIVITAMIN) TABS tablet Take 1 tablet by mouth daily at 12 noon.      . Simethicone (GAS-X PO) Take 1 tablet by mouth as needed (stomach cramp).      . valACYclovir (VALTREX) 500 MG tablet Take 500 mg by mouth daily.        ROS:  Fever, chills, breast engorgement, small amount lochia Physical Exam   Blood pressure 113/59, pulse 124, temperature 100.6 F (38.1 C), temperature source Oral, resp. rate 18, height 5' 8.5" (1.74 m), weight 202 lb (91.627 kg), last menstrual period  09/24/2012, unknown if currently breastfeeding.  Physical Exam Having shaking chills at intervals--appears ill. Chest clear Heart RRR without murmur, but tachycardia Breasts--full, engorged, with scattered red areas over prominent ductal tissue, but NT Abd--gaseous distension noted in upper abdomen, but bowel sounds present in all quadrants.  No rebound or guarding. Fundus firm, NT, approx 14-16 week size. Lochia scant Perineum--2nd degree perineal laceration healing well, NT Ext DTR 2+, no clonus, trace edema, negative Homan's  ED Course  3 day s/p SVB, with fever/chills Presumptive chorioamnionitis at delivery Breastfeeding Recent diarrhea  Plan: IV hydration CBC, diff, CMP, blood cultures x 2 Cath UA Urine culture Pump breasts to relieve engorgement Support and reassurance to patient. Consulted with Dr. Shanon Ace await CBC/diff to determine need for ATB vs viral syndrome.   Donnel Saxon CNM, [place] 06/26/2013 8:35 PM  Addendum:  LC in to see patient and assist with engorgement and use of nipple shield--no s/s mastitis at present.  Filed Vitals:   06/26/13 1946 06/26/13 2126  BP: 113/59 121/70  Pulse: 124 123  Temp: 100.6 F (38.1 C) 99.4 F (37.4 C)  TempSrc: Oral Oral  Resp: 18 20  Height: 5' 8.5" (1.74 m)   Weight: 202 lb (91.627 kg)   SpO2:  95%    Results for orders placed during the hospital encounter of 06/26/13 (from the past 24 hour(s))  CBC WITH DIFFERENTIAL     Status: Abnormal   Collection Time    06/26/13  8:20 PM      Result Value Ref Range   WBC 10.0  4.0 - 10.5 K/uL   RBC 2.67 (*) 3.87 - 5.11 MIL/uL   Hemoglobin 8.0 (*) 12.0 - 15.0 g/dL   HCT 24.0 (*) 36.0 - 46.0 %   MCV 89.9  78.0 - 100.0 fL   MCH 30.0  26.0 - 34.0 pg   MCHC 33.3  30.0 - 36.0 g/dL   RDW 14.9  11.5 - 15.5 %   Platelets 226  150 - 400 K/uL   Neutrophils Relative % 88 (*) 43 - 77 %   Neutro Abs 8.8 (*) 1.7 - 7.7 K/uL   Lymphocytes Relative 7 (*) 12 - 46 %   Lymphs Abs  0.7  0.7 - 4.0 K/uL   Monocytes Relative 4  3 - 12 %   Monocytes Absolute 0.4  0.1 - 1.0 K/uL   Eosinophils Relative 1  0 - 5 %   Eosinophils Absolute 0.1  0.0 - 0.7 K/uL   Basophils Relative 0  0 - 1 %   Basophils Absolute 0.0  0.0 - 0.1 K/uL  COMPREHENSIVE  METABOLIC PANEL     Status: Abnormal   Collection Time    06/26/13  8:20 PM      Result Value Ref Range   Sodium 138  137 - 147 mEq/L   Potassium 3.3 (*) 3.7 - 5.3 mEq/L   Chloride 105  96 - 112 mEq/L   CO2 20  19 - 32 mEq/L   Glucose, Bld 79  70 - 99 mg/dL   BUN 13  6 - 23 mg/dL   Creatinine, Ser 0.68  0.50 - 1.10 mg/dL   Calcium 8.4  8.4 - 10.5 mg/dL   Total Protein 4.9 (*) 6.0 - 8.3 g/dL   Albumin 2.1 (*) 3.5 - 5.2 g/dL   AST 131 (*) 0 - 37 U/L   ALT 62 (*) 0 - 35 U/L   Alkaline Phosphatase 125 (*) 39 - 117 U/L   Total Bilirubin 0.3  0.3 - 1.2 mg/dL   GFR calc non Af Amer >90  >90 mL/min   GFR calc Af Amer >90  >90 mL/min  URINALYSIS, ROUTINE W REFLEX MICROSCOPIC     Status: Abnormal   Collection Time    06/26/13  8:30 PM      Result Value Ref Range   Color, Urine YELLOW  YELLOW   APPearance CLEAR  CLEAR   Specific Gravity, Urine 1.020  1.005 - 1.030   pH 6.0  5.0 - 8.0   Glucose, UA NEGATIVE  NEGATIVE mg/dL   Hgb urine dipstick NEGATIVE  NEGATIVE   Bilirubin Urine NEGATIVE  NEGATIVE   Ketones, ur 15 (*) NEGATIVE mg/dL   Protein, ur NEGATIVE  NEGATIVE mg/dL   Urobilinogen, UA 0.2  0.0 - 1.0 mg/dL   Nitrite NEGATIVE  NEGATIVE   Leukocytes, UA NEGATIVE  NEGATIVE   Assessment: PP fever, hx chorioamnionitis during delivery Elevated AST/ALT Mild leukocytosis Hypokalemia Tachycardia Breastfeeding, with engorgement  Plan: Admitted to [facility] Unit per consult with Dr. Cletis Media IV hydration, with D51/2NS with 20 meq KCL Unasyn 3 gm IV q 6 hours Repeat CBC, diff, CMP in am May continue to breastfeed/pump prn. Support to patient for status. Tylenol prn fever. D/C criteria to include afebrile x 24 hours.  Donnel Saxon, CNM 06/26/13 9:50p

## 2013-06-26 NOTE — Discharge Instructions (Signed)

## 2013-06-26 NOTE — Lactation Note (Addendum)
This note was copied from the chart of [place]. Lactation Consultation Note  Patient Name: Kimberly Holt [facility]'N Date: 06/26/2013 Reason for consult: Follow-up assessment Baby 11 hours old asleep. Mom reports breastfeeding going well. Mom believes baby is latching deeply. Enc mom to feed with cues. Reviewed engorgement prevention/treatment. Given comfort gels with instruction and enc to use colostrum. Mom aware of OP/BFSG services.   Maternal Data    Feeding Feeding Type:  (Mom states baby feeding well, doesn't want to latch right now, enc to deeply latch.)  LATCH Score/Interventions                      Lactation Tools Discussed/Used Tools: Comfort gels (Has hand pump and own DEBP.)   Consult Status Consult Status: Complete    Britany Callicott 06/26/2013, 11:02 AM

## 2013-06-27 LAB — COMPREHENSIVE METABOLIC PANEL
ALBUMIN: 2.1 g/dL — AB (ref 3.5–5.2)
ALT: 82 U/L — ABNORMAL HIGH (ref 0–35)
AST: 148 U/L — ABNORMAL HIGH (ref 0–37)
Alkaline Phosphatase: 121 U/L — ABNORMAL HIGH (ref 39–117)
BILIRUBIN TOTAL: 0.3 mg/dL (ref 0.3–1.2)
BUN: 11 mg/dL (ref 6–23)
CHLORIDE: 105 meq/L (ref 96–112)
CO2: 24 mEq/L (ref 19–32)
CREATININE: 0.7 mg/dL (ref 0.50–1.10)
Calcium: 8.2 mg/dL — ABNORMAL LOW (ref 8.4–10.5)
GFR calc Af Amer: >90 mL/min (ref >90)
GFR calc non Af Amer: >90 mL/min (ref >90)
Glucose, Bld: 87 mg/dL (ref 70–99)
POTASSIUM: 3.5 meq/L — AB (ref 3.7–5.3)
SODIUM: 140 meq/L (ref 137–147)
Total Protein: 5.5 g/dL — ABNORMAL LOW (ref 6.0–8.3)

## 2013-06-27 LAB — CBC WITH DIFFERENTIAL/PLATELET
BASOS ABS: 0 10*3/uL (ref 0.0–0.1)
BASOS PCT: 0 % (ref 0–1)
Eosinophils Absolute: 0.1 10*3/uL (ref 0.0–0.7)
Eosinophils Relative: 1 % (ref 0–5)
HCT: 24.7 % — ABNORMAL LOW (ref 36.0–46.0)
Hemoglobin: 8.4 g/dL — ABNORMAL LOW (ref 12.0–15.0)
Lymphocytes Relative: 11 % — ABNORMAL LOW (ref 12–46)
Lymphs Abs: 1.4 10*3/uL (ref 0.7–4.0)
MCH: 29.8 pg (ref 26.0–34.0)
MCHC: 34 g/dL (ref 30.0–36.0)
MCV: 87.6 fL (ref 78.0–100.0)
MONO ABS: 0.5 10*3/uL (ref 0.1–1.0)
Monocytes Relative: 4 % (ref 3–12)
NEUTROS ABS: 11 10*3/uL — AB (ref 1.7–7.7)
NEUTROS PCT: 85 % — AB (ref 43–77)
Platelets: 231 10*3/uL (ref 150–400)
RBC: 2.82 MIL/uL — ABNORMAL LOW (ref 3.87–5.11)
RDW: 15.1 % (ref 11.5–15.5)
WBC: 13 10*3/uL — ABNORMAL HIGH (ref 4.0–10.5)

## 2013-06-27 NOTE — Progress Notes (Signed)
Post Partum Day 3 Subjective: no complaints, up ad lib and tolerating PO  Objective: Blood pressure 118/73, pulse 104, temperature 98.2 F (36.8 C), temperature source Oral, resp. rate 20, height 5' 8.5" (1.74 m), weight 202 lb (91.627 kg), SpO2 95.00%, unknown if currently breastfeeding.  Physical Exam:  General: alert and cooperative Lochia: appropriate Uterine Fundus: NT Incision: na DVT Evaluation: No evidence of DVT seen on physical exam. CV RRR LUNGS CTAB Breast NT no engorgement or erythema B   Recent Labs  06/26/13 2020 06/27/13 0520  HGB 8.0* 8.4*  HCT 24.0* 24.7*    Assessment/Plan: Febrile Morbidity Pt now afebrile. Would continue unasyn Recheck LFTS in the morning I spoke with Dr Megan Salon he agrees with the plan and would send her home on augmentin for a 7- 10 day course He will see her if she does not improve ucx and blood cultures pending   LOS: 1 day   Hazen A 06/27/2013, 1:18 PM

## 2013-06-28 DIAGNOSIS — D649 Anemia, unspecified: Secondary | ICD-10-CM | POA: Diagnosis present

## 2013-06-28 LAB — COMPREHENSIVE METABOLIC PANEL
ALT: 64 U/L — ABNORMAL HIGH (ref 0–35)
AST: 63 U/L — ABNORMAL HIGH (ref 0–37)
Albumin: 2 g/dL — ABNORMAL LOW (ref 3.5–5.2)
Alkaline Phosphatase: 124 U/L — ABNORMAL HIGH (ref 39–117)
BUN: 11 mg/dL (ref 6–23)
CALCIUM: 8 mg/dL — AB (ref 8.4–10.5)
CO2: 23 mEq/L (ref 19–32)
Chloride: 108 mEq/L (ref 96–112)
Creatinine, Ser: 0.61 mg/dL (ref 0.50–1.10)
GFR calc non Af Amer: >90 mL/min (ref >90)
GLUCOSE: 89 mg/dL (ref 70–99)
Potassium: 3.7 mEq/L (ref 3.7–5.3)
SODIUM: 142 meq/L (ref 137–147)
TOTAL PROTEIN: 5.4 g/dL — AB (ref 6.0–8.3)
Total Bilirubin: 0.2 mg/dL — ABNORMAL LOW (ref 0.3–1.2)

## 2013-06-28 LAB — CBC WITH DIFFERENTIAL/PLATELET
Basophils Absolute: 0 10*3/uL (ref 0.0–0.1)
Basophils Relative: 0 % (ref 0–1)
EOS ABS: 0.3 10*3/uL (ref 0.0–0.7)
Eosinophils Relative: 4 % (ref 0–5)
HCT: 22.4 % — ABNORMAL LOW (ref 36.0–46.0)
Hemoglobin: 7.5 g/dL — ABNORMAL LOW (ref 12.0–15.0)
LYMPHS ABS: 1.8 10*3/uL (ref 0.7–4.0)
Lymphocytes Relative: 26 % (ref 12–46)
MCH: 29.5 pg (ref 26.0–34.0)
MCHC: 33.5 g/dL (ref 30.0–36.0)
MCV: 88.2 fL (ref 78.0–100.0)
MONOS PCT: 4 % (ref 3–12)
Monocytes Absolute: 0.3 10*3/uL (ref 0.1–1.0)
Neutro Abs: 4.6 10*3/uL (ref 1.7–7.7)
Neutrophils Relative %: 66 % (ref 43–77)
PLATELETS: 215 10*3/uL (ref 150–400)
RBC: 2.54 MIL/uL — AB (ref 3.87–5.11)
RDW: 15.1 % (ref 11.5–15.5)
WBC: 6.9 10*3/uL (ref 4.0–10.5)

## 2013-06-28 LAB — URINE CULTURE
Colony Count: NO GROWTH
Culture: NO GROWTH

## 2013-06-28 MED ORDER — AMOXICILLIN-POT CLAVULANATE 875-125 MG PO TABS: 1.0000 | ORAL_TABLET | Freq: Two times a day (BID) | ORAL | Status: DC

## 2013-06-28 MED ORDER — AMOXICILLIN-POT CLAVULANATE 875-125 MG PO TABS: 1.0000 | ORAL_TABLET | Freq: Two times a day (BID) | ORAL | Status: AC

## 2013-06-28 NOTE — Discharge Summary (Signed)
Physician Discharge Summary  Patient ID: Kimberly Holt MRN: [id number] DOB/AGE: 05-31-1975 38 y.o.  Admit date: 06/26/2013 Discharge date: 06/28/2013  Admission Diagnoses:  3d s/p SVB, presumptive pp endometritis, hypokalemia, elevated LFTs, anemia without hemodynamic instability, anxiety.  Discharge Diagnoses:  Principal Problem:   Postpartum fever Active Problems:   Generalized anxiety disorder   Chorioamnionitis   Anemia Anemia--stable.  Discharged Condition: stable  Hospital Course: Admitted 06/26/13 at 3 days s/p SVB, with pp fever and chills.  Hx had been remarkable for fever just prior to delivery, with Unasyn 3 mg IV q 6 hours x 2 doses given pp.  She had planned a BTL, but that was cancelled due to her fever at delivery.  She had significant diarrhea through day 1 pp after delivery, with negative C diff cultures done.  She remained afebrile during her hospitalization and was d/c'd on day 2 in stable condition.  She was breastfeeding.  She presented to [facility]U on the evening of 2/13 with report of fever to 102 and chills.  No evidence of mastitis, UTI, or DVT was noted, so the presumptive dx of pp endometritis was implemented.  Hgb was 8.0, down from 10.3 on admission for delivery, and her WBC ct was 10 on admission, up to a max of 13 on 06/27/13.  She was admitted for IV antibiotics.  She also had elevated AST/ALT, to max of 148/82, with negative amylase and lipase, and no abdominal pain noted.  Her K= was 3.3 on admission, and she received K+ in her IV fluid with resolution.    By 06/28/13, patient had been afebrile > 24 hours, was feeling well, and had AST/ALT of 63/64, WBC count of 6.9, Hgb 7.5, but asymptomatic.  ID had been consulted, and they recommended she be d/c'd home on Augmentin 875 BID x 7-10 days.  She was d/c'd home with precautions reviewed.  Consults: ID--Dr. Megan Salon by Dr. Charlesetta Garibaldi  Significant Diagnostic Studies: labs:  Results for orders placed during the hospital  encounter of 06/26/13 (from the past 24 hour(s))  CBC WITH DIFFERENTIAL     Status: Abnormal   Collection Time    06/28/13  5:00 AM      Result Value Ref Range   WBC 6.9  4.0 - 10.5 K/uL   RBC 2.54 (*) 3.87 - 5.11 MIL/uL   Hemoglobin 7.5 (*) 12.0 - 15.0 g/dL   HCT 22.4 (*) 36.0 - 46.0 %   MCV 88.2  78.0 - 100.0 fL   MCH 29.5  26.0 - 34.0 pg   MCHC 33.5  30.0 - 36.0 g/dL   RDW 15.1  11.5 - 15.5 %   Platelets 215  150 - 400 K/uL   Neutrophils Relative % 66  43 - 77 %   Neutro Abs 4.6  1.7 - 7.7 K/uL   Lymphocytes Relative 26  12 - 46 %   Lymphs Abs 1.8  0.7 - 4.0 K/uL   Monocytes Relative 4  3 - 12 %   Monocytes Absolute 0.3  0.1 - 1.0 K/uL   Eosinophils Relative 4  0 - 5 %   Eosinophils Absolute 0.3  0.0 - 0.7 K/uL   Basophils Relative 0  0 - 1 %   Basophils Absolute 0.0  0.0 - 0.1 K/uL  COMPREHENSIVE METABOLIC PANEL     Status: Abnormal   Collection Time    06/28/13  5:00 AM      Result Value Ref Range   Sodium 142  137 -  147 mEq/L   Potassium 3.7  3.7 - 5.3 mEq/L   Chloride 108  96 - 112 mEq/L   CO2 23  19 - 32 mEq/L   Glucose, Bld 89  70 - 99 mg/dL   BUN 11  6 - 23 mg/dL   Creatinine, Ser 0.61  0.50 - 1.10 mg/dL   Calcium 8.0 (*) 8.4 - 10.5 mg/dL   Total Protein 5.4 (*) 6.0 - 8.3 g/dL   Albumin 2.0 (*) 3.5 - 5.2 g/dL   AST 63 (*) 0 - 37 U/L   ALT 64 (*) 0 - 35 U/L   Alkaline Phosphatase 124 (*) 39 - 117 U/L   Total Bilirubin 0.2 (*) 0.3 - 1.2 mg/dL   GFR calc non Af Amer >90  >90 mL/min   GFR calc Af Amer >90  >90 mL/min     Treatments: IV hydration and antibiotics: Unasyn  Discharge Exam: Blood pressure 122/75, pulse 82, temperature 97.6 F (36.4 C), temperature source Oral, resp. rate 16, height 5' 8.5" (1.74 m), weight 202 lb (91.627 kg), SpO2 98.00%, unknown if currently breastfeeding. Appears well Chest clear Heart RRR without murmur Abdomen--soft, NT, + bowel sounds Pelvic--deferred, small amount lochia, uterus NT, approx 12-14 week size Ext  WNL  Urine culture negative Blood cultures pending at discharge.  Disposition: 01-Home or Self Care  Tubal planned after 6 weeks.     Medication List    STOP taking these medications       valACYclovir 500 MG tablet  Commonly known as:  VALTREX      TAKE these medications       amoxicillin-clavulanate 875-125 MG per tablet  Commonly known as:  AUGMENTIN  Take 1 tablet by mouth 2 (two) times daily.     DICLEGIS PO  Take 2 tablets by mouth at bedtime.     GAS-X PO  Take 1 tablet by mouth as needed (stomach cramp).     ibuprofen 600 MG tablet  Commonly known as:  ADVIL,MOTRIN  Take 1 tablet (600 mg total) by mouth every 6 (six) hours as needed.     loperamide 2 MG capsule  Commonly known as:  IMODIUM  Take 1 capsule (2 mg total) by mouth as needed for diarrhea or loose stools (with eac new stool).     prenatal multivitamin Tabs tablet  Take 1 tablet by mouth daily at 12 noon.       Follow-up Information   Follow up with [facility] In 5 weeks. (Call for any questions or concerns.  Schedule 6 week pp visit.)    Specialty:  Obstetrics and Gynecology   Contact information:   [place] [address] [phone]      Signed: Donnel Saxon 06/28/2013, 9:17 AM

## 2013-06-28 NOTE — Progress Notes (Signed)
Discharge instructions provided to patient at bedside.  No questions at this time.  Patient left unit in stable condition with all personal belongings accompanied by staff.  K. Kimela Malstrom, RN-------------- 

## 2013-06-28 NOTE — Plan of Care (Signed)
Problem: Phase II Progression Outcomes Goal: Progress activity as tolerated unless otherwise ordered Outcome: Completed/Met Date Met:  06/28/13 Ambulates a lot in room, a little in the hallway and tolerates well. Goal: Discharge plan established Outcome: Completed/Met Date Met:  06/28/13 Has remained afebrile for more than 24 hours Goal: IV changed to normal saline lock Outcome: Not Progressing IV continues to infuse site was changed and tubing changed as well.  Problem: Phase III Progression Outcomes Goal: Pain controlled on oral analgesia Outcome: Completed/Met Date Met:  06/28/13 Good pain control on PO Motrin

## 2013-06-28 NOTE — Plan of Care (Signed)
Problem: Discharge Progression Outcomes Goal: Discharge plan in place and appropriate Outcome: Completed/Met Date Met:  06/28/13 Afebrile God pain control on po Motrin

## 2013-06-28 NOTE — Discharge Instructions (Signed)
Endometritis °Endometritis is an irritation, soreness, and swelling (inflammation) of the lining of the uterus (endometrium).  °CAUSES  °· Bacterial infections. °· Sexually transmitted infections (STIs). °· Having a miscarriage or childbirth, especially after a long labor or cesarean delivery. °· Certain gynecological procedures (such as dilation and curettage, hysteroscopy, or contraceptive insertion). °SIGNS AND SYMPTOMS  °· Fever. °· Lower abdominal or pelvic pain. °· Abnormal vaginal discharge or bleeding. °· Abdominal bloating (distention) or swelling. °· General discomfort or ill feeling. °· Discomfort with bowel movements. °DIAGNOSIS  °A physical and pelvic exam are performed. Other tests may include: °· Cultures from the cervix. °· Blood tests. °· Examining a tissue sample of the uterine lining (endometrial biopsy). °· Examining discharge under a microscope (wet prep). °· Laparoscopy. °TREATMENT  °Antibiotic medicines are usually given. Other treatments may include: °· Fluids through an IV tube inserted in your vein. °· Rest. °HOME CARE INSTRUCTIONS  °· Take over-the-counter or prescription medicines for pain, discomfort, or fever as directed by your health care provider. °· Take your antibiotics as directed. Finish them even if you start to feel better. °· Resume your normal diet and activities as directed or as tolerated. °· Do not douche or have sexual intercourse until your health care provider says it is okay. °· Do not have sexual intercourse until your partner has been treated if your endometritis is caused by an STI. °SEEK IMMEDIATE MEDICAL CARE IF:  °· You have swelling or increasing pain in the abdomen. °· You have a fever. °· You have bad smelling vaginal discharge, or you have an increased amount of discharge. °· You have abnormal vaginal bleeding. °· Your medicine is not helping with the pain. °· You experience any problems that may be related to the medicine you are taking. °· You have nausea  and vomiting, or you cannot keep foods down. °· You have pain with bowel movements. °MAKE SURE YOU:  °· Understand these instructions. °· Will watch your condition. °· Will get help right away if you are not doing well or get worse. °Document Released: 04/24/2001 Document Revised: 12/31/2012 Document Reviewed: 11/27/2012 °ExitCare® Patient Information ©2014 [employer]. ° °

## 2013-06-29 ENCOUNTER — Encounter (HOSPITAL_COMMUNITY): Payer: Self-pay | Admitting: *Deleted

## 2013-06-29 NOTE — Progress Notes (Signed)
Post discharge review completed. 

## 2013-07-01 ENCOUNTER — Inpatient Hospital Stay (HOSPITAL_COMMUNITY): Payer: BC Managed Care – PPO

## 2013-07-01 ENCOUNTER — Inpatient Hospital Stay (HOSPITAL_COMMUNITY)
Admission: AD | Admit: 2013-07-01 | Discharge: 2013-07-01 | Disposition: A | Discharge status: Home / Self Care | Payer: BC Managed Care – PPO | Source: Ambulatory Visit | Attending: Obstetrics and Gynecology | Admitting: Obstetrics and Gynecology

## 2013-07-01 DIAGNOSIS — J90 Pleural effusion, not elsewhere classified: Secondary | ICD-10-CM | POA: Insufficient documentation

## 2013-07-01 DIAGNOSIS — O9989 Other specified diseases and conditions complicating pregnancy, childbirth and the puerperium: Principal | ICD-10-CM

## 2013-07-01 DIAGNOSIS — R0602 Shortness of breath: Secondary | ICD-10-CM | POA: Insufficient documentation

## 2013-07-01 DIAGNOSIS — O99893 Other specified diseases and conditions complicating puerperium: Secondary | ICD-10-CM | POA: Insufficient documentation

## 2013-07-01 LAB — CBC
HEMATOCRIT: 25.1 % — AB (ref 36.0–46.0)
HEMOGLOBIN: 8.4 g/dL — AB (ref 12.0–15.0)
MCH: 29.3 pg (ref 26.0–34.0)
MCHC: 33.5 g/dL (ref 30.0–36.0)
MCV: 87.5 fL (ref 78.0–100.0)
Platelets: 318 10*3/uL (ref 150–400)
RBC: 2.87 MIL/uL — ABNORMAL LOW (ref 3.87–5.11)
RDW: 14.9 % (ref 11.5–15.5)
WBC: 8 10*3/uL (ref 4.0–10.5)

## 2013-07-01 LAB — PROTEIN / CREATININE RATIO, URINE
Creatinine, Urine: 128.66 mg/dL
Protein Creatinine Ratio: 0.13 (ref 0.00–0.15)
Total Protein, Urine: 16.4 mg/dL

## 2013-07-01 LAB — COMPREHENSIVE METABOLIC PANEL
ALK PHOS: 131 U/L — AB (ref 39–117)
ALT: 45 U/L — AB (ref 0–35)
AST: 34 U/L (ref 0–37)
Albumin: 2.5 g/dL — ABNORMAL LOW (ref 3.5–5.2)
BUN: 10 mg/dL (ref 6–23)
CHLORIDE: 106 meq/L (ref 96–112)
CO2: 23 mEq/L (ref 19–32)
Calcium: 8.5 mg/dL (ref 8.4–10.5)
Creatinine, Ser: 0.64 mg/dL (ref 0.50–1.10)
GFR calc non Af Amer: >90 mL/min (ref >90)
GLUCOSE: 83 mg/dL (ref 70–99)
POTASSIUM: 3.6 meq/L — AB (ref 3.7–5.3)
Sodium: 141 mEq/L (ref 137–147)
Total Bilirubin: 0.2 mg/dL — ABNORMAL LOW (ref 0.3–1.2)
Total Protein: 6.1 g/dL (ref 6.0–8.3)

## 2013-07-01 LAB — LACTATE DEHYDROGENASE: LDH: 320 U/L — ABNORMAL HIGH (ref 94–250)

## 2013-07-01 LAB — URIC ACID: URIC ACID, SERUM: 6.6 mg/dL (ref 2.4–7.0)

## 2013-07-01 MED ORDER — ALBUTEROL SULFATE (2.5 MG/3ML) 0.083% IN NEBU
2.5000 mg | INHALATION_SOLUTION | Freq: Once | RESPIRATORY_TRACT | Status: AC
  Administered 2013-07-01: 2.5 mg via RESPIRATORY_TRACT
  Filled 2013-07-01: qty 3

## 2013-07-01 NOTE — MAU Note (Signed)
Pt states she had fever and stomach bug, infection in the womb.

## 2013-07-01 NOTE — Discharge Instructions (Signed)
Incentive Spirometer An incentive spirometer is a tool that can help keep your lungs clear and active. This tool measures how well you are filling your lungs with each breath. Taking long deep breaths may help reverse or decrease the chance of developing breathing (pulmonary) problems (especially infection) following:  Surgery of the chest or abdomen.  Surgery if you have a history of smoking or a lung problem.  A long period of time when you are unable to move or be active. BEFORE THE PROCEDURE   If the spirometer includes an indictor to show your best effort, your nurse or respiratory therapist will set it to a desired goal.  If possible, sit up straight or lean slightly forward. Try not to slouch.  Hold the incentive spirometer in an upright position. INSTRUCTIONS FOR USE  1. Sit on the edge of your bed if possible, or sit up as far as you can in bed or on a chair. 2. Hold the incentive spirometer in an upright position. 3. Breathe out normally. 4. Place the mouthpiece in your mouth and seal your lips tightly around it. 5. Breathe in slowly and as deeply as possible, raising the piston or the ball toward the top of the column. 6. Hold your breath for 3-5 seconds or for as long as possible. Allow the piston or ball to fall to the bottom of the column. 7. Remove the mouthpiece from your mouth and breathe out normally. 8. Rest for a few seconds and repeat Steps 1 through 7 at least 10 times every 1-2 hours when you are awake. Take your time and take a few normal breaths between deep breaths. 9. The spirometer may include an indicator to show your best effort. Use the indicator as a goal to work toward during each repetition. 10. After each set of 10 deep breaths, practice coughing to be sure your lungs are clear. If you have an incision (the cut made at the time of surgery), support your incision when coughing by placing a pillow or rolled up towels firmly against it. Once you are able to  get out of bed, walk around indoors and cough well. You may stop using the incentive spirometer when instructed by your caregiver.  RISKS AND COMPLICATIONS  Breathing too quickly may cause dizziness. At an extreme, this could cause you to pass out. Take your time so you do not get dizzy or light-headed.  If you are in pain, you may need to take or ask for pain medication before doing incentive spirometry. It is harder to take a deep breath if you are having pain. AFTER USE  Rest and breathe slowly and easily.  It can be helpful to keep track of a log of your progress. Your caregiver can provide you with a simple table to help with this. If you are using the spirometer at home, follow these instructions: SEEK MEDICAL CARE IF:   You are having difficultly using the spirometer.  You have trouble using the spirometer as often as instructed.  Your pain medication is not giving enough relief while using the spirometer.  You develop fever of 100.5 F (38.1 C) or higher. SEEK IMMEDIATE MEDICAL CARE IF:   You cough up bloody sputum that had not been present before.  You develop fever of 102 F (38.9 C) or greater.  You develop worsening pain at or near the incision site. MAKE SURE YOU:   Understand these instructions.  Will watch your condition.  Will get help right away   if you are not doing well or get worse. Document Released: 09/10/2006 Document Revised: 07/23/2011 Document Reviewed: 11/11/2006 ExitCare Patient Information 2014 [employer].  

## 2013-07-01 NOTE — MAU Note (Signed)
Pt states she was seen yesterday in the office, Pt flexeril yesterday and this morning  ![id number] and [phone].

## 2013-07-01 NOTE — Progress Notes (Signed)
Pt states she had a headache on the way over here doesn't have one right now

## 2013-07-01 NOTE — Progress Notes (Signed)
Informed that midwife is in delivery

## 2013-07-03 ENCOUNTER — Other Ambulatory Visit (INDEPENDENT_AMBULATORY_CARE_PROVIDER_SITE_OTHER): Payer: BC Managed Care – PPO

## 2013-07-03 ENCOUNTER — Encounter: Payer: Self-pay | Admitting: Internal Medicine

## 2013-07-03 ENCOUNTER — Ambulatory Visit (INDEPENDENT_AMBULATORY_CARE_PROVIDER_SITE_OTHER): Payer: BC Managed Care – PPO | Admitting: Internal Medicine

## 2013-07-03 VITALS — BP 142/84 | HR 98 | Temp 98.2°F | Ht 68.5 in | Wt 192.2 lb

## 2013-07-03 DIAGNOSIS — J810 Acute pulmonary edema: Secondary | ICD-10-CM

## 2013-07-03 LAB — TSH: TSH: 0.73 u[IU]/mL (ref 0.35–5.50)

## 2013-07-03 LAB — CULTURE, BLOOD (ROUTINE X 2)
Culture: NO GROWTH
Culture: NO GROWTH

## 2013-07-03 LAB — BRAIN NATRIURETIC PEPTIDE: Pro B Natriuretic peptide (BNP): 497 pg/mL — ABNORMAL HIGH (ref 0.0–100.0)

## 2013-07-03 MED ORDER — FUROSEMIDE 20 MG PO TABS: ORAL_TABLET | ORAL | Status: DC

## 2013-07-03 NOTE — Progress Notes (Signed)
Subjective:    Patient ID: Kimberly Holt, female    DOB: 1976/04/14  MRN: [id number]  HPI  31 yowf never smoker referred 07/03/13 by Earnstine Regal to pulmonary clinic for unexplained sob.  07/05/2013 1st [facility] office visit/ Kimberly Holt    Chief Complaint  Patient presents with  . Pulmonary Consult    Referred per Dr. Raphael Gibney. Pt cough non prod cough and SOB with or without exertion.x 2 days. Has also had fever.     uneventful pregnancy except for some problem with LE swelling then term baby 06/23/13 with problems with lightheaded and  Shakes soon after the epidural placed and  rx fluids and baby delivered w/in 12 hours of epidural  Then w/in next 12 h fever and rx for presumptive chorioaminitis with Unasyn   And  Then diarrhea 2/11 with neg C diff and discharge on 06/26/13  and home x 3 h then shakes and temp 101.4 > readmitted and discharged 2/15 with dx pna rx augmentin and feeling fine until 2/18  worse back pain shooting to neck and rx flexoril then soon thereafter developed sob then cough dry back to ER with cxr > Diffuse interstitial prominence most confluent in the lung base with small right pleural effusion, could reflect atypical pneumonia.  Cough improved but not the breathing.  Never had nasty sputum/ fever now gone / never had proteinuria but albumin down to 2.0.  Immediately sob when supine, better sitting up, still has swelling both legs.   No obvious day to day or daytime variabilty or assoc  cp or chest tightness, subjective wheeze overt sinus or hb symptoms. No unusual exp hx or h/o childhood pna/ asthma or knowledge of premature birth.   Also denies any obvious fluctuation of symptoms with weather or environmental changes or other aggravating or alleviating factors except as outlined above   Current Medications, Allergies, Complete Past Medical History, Past Surgical History, Family History, and Social History were reviewed in [employer] record.          Review of Systems  Constitutional: Negative for fever, chills and unexpected weight change.  HENT: Negative for congestion, dental problem, ear pain, nosebleeds, postnasal drip, rhinorrhea, sinus pressure, sneezing, sore throat, trouble swallowing and voice change.   Eyes: Negative for visual disturbance.  Respiratory: Positive for cough and shortness of breath. Negative for choking.   Cardiovascular: Negative for chest pain and leg swelling.  Gastrointestinal: Negative for vomiting, abdominal pain and diarrhea.  Genitourinary: Negative for difficulty urinating.  Musculoskeletal: Negative for arthralgias.  Skin: Negative for rash.  Neurological: Positive for headaches. Negative for tremors and syncope.  Hematological: Does not bruise/bleed easily.       Objective:   Physical Exam  amb wf nad  Wt Readings from Last 3 Encounters:  07/03/13 192 lb 3.2 oz (87.181 kg)  06/26/13 202 lb (91.627 kg)  06/23/13 202 lb (91.627 kg)      HEENT: nl dentition, turbinates, and orophanx. Nl external ear canals without cough reflex   NECK :  without JVD/Nodes/TM/ nl carotid upstrokes bilaterally   LUNGS: no acc muscle use, clear to A and P bilaterally without cough on insp or exp maneuvers   CV:  RRR  no s3 or murmur or increase in P2,  1 plus  Pitting edema both lower ext  ABD:  soft and nontender with nl excursion in the supine position. No bruits or organomegaly, bowel sounds nl  MS:  warm without deformities, calf tenderness,  cyanosis or clubbing  SKIN: warm and dry without lesions    NEURO:  alert, approp, no deficits    cxr 07/01/13 Diffuse interstitial prominence most confluent in the lung base with  small right pleural effusion, could reflect atypical pneumonia.  Recommend followup chest radiograph after treatment to verify  improvement.       Assessment & Plan:

## 2013-07-03 NOTE — Progress Notes (Signed)
Quick Note:  Spoke with pt and notified of results per Dr. Wert. Pt verbalized understanding and denied any questions.  ______ 

## 2013-07-03 NOTE — Patient Instructions (Addendum)
Lasix 20 mg take two now asap and then one daily until feeling normal with no fluid in legs and able to lie flat - check with Dr Raphael Gibney re lactation on lasix  Finish your augmentin  No salt in diet but eat lots of fruits and vegetables to replenish your potassium  Please remember to go to the lab  department downstairs for your tests - we will call you with the results when they are available.  Please schedule a follow up office visit in 1 week , sooner if needed with cxr

## 2013-07-05 NOTE — Assessment & Plan Note (Addendum)
Onset was post partum 06/23/13  - BNP 497 07/03/13  Likely has component of diastolic chf related to pregnancy and edema exac by alb = 2.0 vs ali related to choriomaminitis, resolved.   rec rx with lasix 40mg  now and 20 mg qam and f/u in one week with cxr.  Probably won't need echo if cxr normalizes and does ok off lasix  Discussed risks of lasix in lactation and asked her to discuss with ob.

## 2013-07-13 ENCOUNTER — Ambulatory Visit (INDEPENDENT_AMBULATORY_CARE_PROVIDER_SITE_OTHER): Payer: BC Managed Care – PPO | Admitting: Internal Medicine

## 2013-07-13 ENCOUNTER — Ambulatory Visit (INDEPENDENT_AMBULATORY_CARE_PROVIDER_SITE_OTHER)
Admission: RE | Admit: 2013-07-13 | Discharge: 2013-07-13 | Disposition: A | Discharge status: Home / Self Care | Payer: BC Managed Care – PPO | Source: Ambulatory Visit | Attending: Internal Medicine | Admitting: Internal Medicine

## 2013-07-13 ENCOUNTER — Other Ambulatory Visit (INDEPENDENT_AMBULATORY_CARE_PROVIDER_SITE_OTHER): Payer: BC Managed Care – PPO

## 2013-07-13 ENCOUNTER — Encounter: Payer: Self-pay | Admitting: Internal Medicine

## 2013-07-13 VITALS — BP 114/80 | HR 107 | Temp 98.0°F | Ht 68.5 in | Wt 181.0 lb

## 2013-07-13 DIAGNOSIS — D649 Anemia, unspecified: Secondary | ICD-10-CM

## 2013-07-13 DIAGNOSIS — J810 Acute pulmonary edema: Secondary | ICD-10-CM

## 2013-07-13 LAB — CBC WITH DIFFERENTIAL/PLATELET
BASOS PCT: 0.6 % (ref 0.0–3.0)
Basophils Absolute: 0 10*3/uL (ref 0.0–0.1)
EOS PCT: 0.6 % (ref 0.0–5.0)
Eosinophils Absolute: 0 10*3/uL (ref 0.0–0.7)
HEMATOCRIT: 30.8 % — AB (ref 36.0–46.0)
HEMOGLOBIN: 9.9 g/dL — AB (ref 12.0–15.0)
LYMPHS ABS: 1.6 10*3/uL (ref 0.7–4.0)
Lymphocytes Relative: 21 % (ref 12.0–46.0)
MCHC: 32.2 g/dL (ref 30.0–36.0)
MCV: 88.7 fl (ref 78.0–100.0)
Monocytes Absolute: 0.3 10*3/uL (ref 0.1–1.0)
Monocytes Relative: 3.4 % (ref 3.0–12.0)
Neutro Abs: 5.6 10*3/uL (ref 1.4–7.7)
Neutrophils Relative %: 74.4 % (ref 43.0–77.0)
Platelets: 368 10*3/uL (ref 150.0–400.0)
RBC: 3.47 Mil/uL — AB (ref 3.87–5.11)
RDW: 15.3 % — ABNORMAL HIGH (ref 11.5–14.6)
WBC: 7.5 10*3/uL (ref 4.5–10.5)

## 2013-07-13 LAB — HEPATIC FUNCTION PANEL
ALK PHOS: 129 U/L — AB (ref 39–117)
ALT: 22 U/L (ref 0–35)
AST: 23 U/L (ref 0–37)
Albumin: 3.4 g/dL — ABNORMAL LOW (ref 3.5–5.2)
Bilirubin, Direct: 0 mg/dL (ref 0.0–0.3)
Total Bilirubin: 0.7 mg/dL (ref 0.3–1.2)
Total Protein: 7.1 g/dL (ref 6.0–8.3)

## 2013-07-13 LAB — BRAIN NATRIURETIC PEPTIDE: PRO B NATRI PEPTIDE: 69 pg/mL (ref 0.0–100.0)

## 2013-07-13 NOTE — Progress Notes (Signed)
Subjective:    Patient ID: Kimberly Holt, female    DOB: 1975-07-16  MRN: [id number]  HPI  50 yowf never smoker referred 07/03/13 by Earnstine Regal to pulmonary clinic for unexplained sob.  07/05/2013 1st [facility] office visit/ Kimberly Holt    Chief Complaint  Patient presents with  . Pulmonary Consult    Referred per Dr. Raphael Gibney. Pt cough non prod cough and SOB with or without exertion.x 2 days. Has also had fever.     uneventful pregnancy except for some problem with LE swelling then term baby 06/23/13 with problems with lightheaded and  Shakes soon after the epidural placed and  rx fluids and baby delivered w/in 12 hours of epidural  Then w/in next 12 h fever and rx for presumptive chorioaminitis with Unasyn   And  Then diarrhea 2/11 with neg C diff and discharge on 06/26/13  and home x 3 h then shakes and temp 101.4 > readmitted and discharged 2/15 with dx pna rx augmentin and feeling fine until 2/18  worse back pain shooting to neck and rx flexoril then soon thereafter developed sob then cough dry back to ER with cxr > Diffuse interstitial prominence most confluent in the lung base with small right pleural effusion, could reflect atypical pneumonia.  Cough improved but not the breathing.  Never had nasty sputum/ fever now gone / never had proteinuria but albumin down to 2.0.  Immediately sob when supine, better sitting up, still has swelling both legs.  rec Lasix 20 mg take two now asap and then one daily until feeling normal with no fluid in legs and able to lie flat - check with Dr Raphael Gibney re lactation on lasix Finish your augmentin No salt in diet but eat lots of fruits and vegetables to replenish your potassium   07/13/2013 f/u ov/Kimberly Holt re: pulmonary edema post partum Chief Complaint  Patient presents with  . Follow-up    Pt states breathing is back to her normal baseline. Still has occ cough and minimal edema.   Not limited by breathing from desired activities   No obvious day to  day or daytime variabilty or assoc chronic cough or cp or chest tightness, subjective wheeze overt sinus or hb symptoms. No unusual exp hx or h/o childhood pna/ asthma or knowledge of premature birth.  Sleeping ok without nocturnal  or early am exacerbation  of respiratory  c/o's or need for noct saba. Also denies any obvious fluctuation of symptoms with weather or environmental changes or other aggravating or alleviating factors except as outlined above   Current Medications, Allergies, Complete Past Medical History, Past Surgical History, Family History, and Social History were reviewed in [employer] record.  ROS  The following are not active complaints unless bolded sore throat, dysphagia, dental problems, itching, sneezing,  nasal congestion or excess/ purulent secretions, ear ache,   fever, chills, sweats, unintended wt loss, pleuritic or exertional cp, hemoptysis,  orthopnea pnd or leg swelling, presyncope, palpitations, heartburn, abdominal pain, anorexia, nausea, vomiting, diarrhea  or change in bowel or urinary habits, change in stools or urine, dysuria,hematuria,  rash, arthralgias, visual complaints, headache, numbness weakness or ataxia or problems with walking or coordination,  change in mood/affect or memory.                     Objective:   Physical Exam  amb wf nad  07/13/2013         187  Wt Readings from Last  3 Encounters:  07/03/13 192 lb 3.2 oz (87.181 kg)  06/26/13 202 lb (91.627 kg)  06/23/13 202 lb (91.627 kg)      HEENT: nl dentition, turbinates, and orophanx. Nl external ear canals without cough reflex   NECK :  without JVD/Nodes/TM/ nl carotid upstrokes bilaterally   LUNGS: no acc muscle use, clear to A and P bilaterally without cough on insp or exp maneuvers   CV:  RRR  no s3 or murmur or increase in P2,   No edema  ABD:  soft and nontender with nl excursion in the supine position. No bruits or organomegaly, bowel sounds  nl  MS:  warm without deformities, calf tenderness, cyanosis or clubbing  SKIN: warm and dry without lesions    NEURO:  alert, approp, no deficits    CXR  07/13/2013 :  No evidence of acute cardiopulmonary disease.  Prior lower lobe opacities have resolved   Labs 07/13/13 all markedly improved bnp, lfts, hgb      Assessment & Plan:

## 2013-07-13 NOTE — Assessment & Plan Note (Addendum)
Onset was post partum 06/23/13  - BNP 497 07/03/13 down to 69 07/13/2013  With Alb nl, cxr cleared > off lasix s flare, so no further f/u needed in absence of desire for another pregnancy.

## 2013-07-13 NOTE — Patient Instructions (Addendum)
Please remember to go to the lab and x-ray department downstairs for your tests - we will call you with the results when they are available.      

## 2013-07-13 NOTE — Assessment & Plan Note (Signed)
  Recent Labs Lab 07/13/13 1101  HGB 9.9*     Also trending up nicely, no further w/u needed

## 2013-08-11 ENCOUNTER — Encounter (HOSPITAL_COMMUNITY): Payer: Self-pay

## 2013-08-24 ENCOUNTER — Encounter (HOSPITAL_COMMUNITY): Payer: Self-pay | Admitting: Pharmacist

## 2013-08-25 ENCOUNTER — Other Ambulatory Visit: Payer: Self-pay | Admitting: Obstetrics and Gynecology

## 2013-09-03 ENCOUNTER — Ambulatory Visit (HOSPITAL_COMMUNITY)
Admission: RE | Admit: 2013-09-03 | Payer: BC Managed Care – PPO | Source: Ambulatory Visit | Admitting: Obstetrics and Gynecology

## 2013-09-03 ENCOUNTER — Encounter (HOSPITAL_COMMUNITY): Admission: RE | Payer: Self-pay | Source: Ambulatory Visit

## 2013-09-03 HISTORY — DX: Gastro-esophageal reflux disease without esophagitis: K21.9

## 2013-09-03 HISTORY — DX: Other specified postprocedural states: Z98.890

## 2013-09-03 HISTORY — DX: Other specified postprocedural states: R11.2

## 2013-09-03 SURGERY — SALPINGECTOMY, BILATERAL, LAPAROSCOPIC
Anesthesia: General | Laterality: Bilateral

## 2014-03-15 ENCOUNTER — Encounter (HOSPITAL_COMMUNITY): Payer: Self-pay

## 2014-03-31 ENCOUNTER — Other Ambulatory Visit: Payer: Self-pay | Admitting: Obstetrics and Gynecology

## 2014-04-19 ENCOUNTER — Other Ambulatory Visit: Payer: Self-pay | Admitting: Obstetrics and Gynecology

## 2014-04-21 ENCOUNTER — Encounter (HOSPITAL_BASED_OUTPATIENT_CLINIC_OR_DEPARTMENT_OTHER): Payer: Self-pay | Admitting: *Deleted

## 2014-04-22 ENCOUNTER — Encounter (HOSPITAL_BASED_OUTPATIENT_CLINIC_OR_DEPARTMENT_OTHER): Payer: Self-pay | Admitting: *Deleted

## 2014-04-22 NOTE — Progress Notes (Addendum)
NPO AFTER MN WITH EXCEPTION CLEAR  LIQUIDS UNTIL 0900 (NO CREAM/MILK PRODUCTS).  NEEDS CBC AND URINE PREG.  ARRIVE AT 1300. MAY TAKE TYLENOL IF NEEDED W/ SIPS OF WATER AM DOS.

## 2014-04-26 NOTE — H&P (Signed)
Admission History and Physical Exam for a Gynecology Patient  [name] is a 38 y.o. female, [phone], who presents for bilateral salpingectomies for sterilization, hysteroscopy, D&C, NovaSure ablation of the endometrium, and perineoplasty because of dyspareunia. She has been followed at the [facility] and Gynecology division of [facility]. The patient desires sterilization.  Her husband declines vasectomy.  The patient complains of menorrhagia.  She complains of a "lump" at the introitus.  This area causes dyspareunia.  She wishes to have it surgically removed.  OB History    Gravida Para Term Preterm AB TAB SAB Ectopic Multiple Living   6 3 2  3  3   2       Past Medical History  Diagnosis Date  . HSV infection   . HPV (human papilloma virus) infection   . History of anemia   . History of kidney stones 11/03/2012  . Anxiety   . Vaginal Pap smear, abnormal     rpt was WNL  . GERD (gastroesophageal reflux disease)   . PONV (postoperative nausea and vomiting)   . Migraines   . History of acute pulmonary edema     post portum  06-23-2013--  resolved with no residual    No prescriptions prior to admission    Past Surgical History  Procedure Laterality Date  . Cystoscopy/retrograde/ureteroscopy  10/05/2011    Procedure: CYSTOSCOPY/RETROGRADE/URETEROSCOPY;  Surgeon: Hanley Ben, MD;  Location: [facility];  Service: Urology;  Laterality: Right;  . Wisdom tooth extraction    . Appendectomy  AGE 31    Allergies  Allergen Reactions  . Codeine Nausea And Vomiting    SEVERE  . Hydrocodone Nausea And Vomiting    SEVERE    Family History: family history includes Cancer in her maternal grandmother, paternal aunt, and paternal grandmother; Diabetes in her father; Heart disease in her father and paternal uncle; Hypertension in her father.  Social History:  reports that she has never smoked. She has never used smokeless  tobacco. She reports that she does not drink alcohol or use illicit drugs.  Review of systems: See HPI.  Admission Physical Exam:    Body mass index is 26.22 kg/(m^2).  Height 5' 8.5" (1.74 m), weight 175 lb (79.379 kg), last menstrual period 04/01/2014, currently breastfeeding.  HEENT:                 Within normal limits Chest:                   Clear Heart:                    Regular rate and rhythm Breasts:                No masses, skin changes, bleeding, or discharge present Abdomen:             Nontender, no masses Extremities:          Grossly normal Neurologic exam: Grossly normal  Pelvic exam:  External genitalia: normal general appearance Vaginal: normal without tenderness, induration or masses (will identify Lump in the preoperative area) Cervix: normal appearance Adnexa: normal bimanual exam Uterus: normal size shape and consistency Rectal: no masses  Assessment:  Desires sterilization  Menorrhagia  Dyspareunia  "Lump" at the introitus  Plan:  The patient will undergo laparoscopic bilateral salpingectomies, hysteroscopy, D&C, NovaSure ablation of the endometrium, and perineoplasty.  She understands the indications for her surgical procedure.  She accepts the risk of, but not limited to, anesthetic complications, bleeding, infections, and possible damage to the surrounding organs.   Eli Hose 04/26/2014

## 2014-04-27 ENCOUNTER — Encounter (HOSPITAL_BASED_OUTPATIENT_CLINIC_OR_DEPARTMENT_OTHER): Payer: Self-pay | Admitting: *Deleted

## 2014-04-27 ENCOUNTER — Ambulatory Visit (HOSPITAL_BASED_OUTPATIENT_CLINIC_OR_DEPARTMENT_OTHER): Payer: BC Managed Care – PPO | Admitting: Anesthesiology

## 2014-04-27 ENCOUNTER — Encounter (HOSPITAL_BASED_OUTPATIENT_CLINIC_OR_DEPARTMENT_OTHER)
Admission: RE | Disposition: A | Discharge status: Home / Self Care | Payer: Self-pay | Source: Ambulatory Visit | Attending: Obstetrics and Gynecology

## 2014-04-27 ENCOUNTER — Ambulatory Visit (HOSPITAL_BASED_OUTPATIENT_CLINIC_OR_DEPARTMENT_OTHER)
Admission: RE | Admit: 2014-04-27 | Discharge: 2014-04-27 | Disposition: A | Discharge status: Home / Self Care | Payer: BC Managed Care – PPO | Source: Ambulatory Visit | Attending: Obstetrics and Gynecology | Admitting: Obstetrics and Gynecology

## 2014-04-27 DIAGNOSIS — N941 Dyspareunia: Secondary | ICD-10-CM | POA: Insufficient documentation

## 2014-04-27 DIAGNOSIS — N9089 Other specified noninflammatory disorders of vulva and perineum: Secondary | ICD-10-CM | POA: Insufficient documentation

## 2014-04-27 DIAGNOSIS — N920 Excessive and frequent menstruation with regular cycle: Secondary | ICD-10-CM | POA: Insufficient documentation

## 2014-04-27 DIAGNOSIS — F419 Anxiety disorder, unspecified: Secondary | ICD-10-CM | POA: Insufficient documentation

## 2014-04-27 DIAGNOSIS — K219 Gastro-esophageal reflux disease without esophagitis: Secondary | ICD-10-CM | POA: Insufficient documentation

## 2014-04-27 DIAGNOSIS — Z302 Encounter for sterilization: Secondary | ICD-10-CM | POA: Insufficient documentation

## 2014-04-27 HISTORY — PX: PERINEOPLASTY: SHX2218

## 2014-04-27 HISTORY — PX: LAPAROSCOPIC BILATERAL SALPINGECTOMY: SHX5889

## 2014-04-27 HISTORY — DX: Personal history of other diseases of the respiratory system: Z87.09

## 2014-04-27 HISTORY — PX: DILITATION & CURRETTAGE/HYSTROSCOPY WITH NOVASURE ABLATION: SHX5568

## 2014-04-27 LAB — POCT PREGNANCY, URINE: PREG TEST UR: NEGATIVE

## 2014-04-27 LAB — CBC
HCT: 38.6 % (ref 36.0–46.0)
HEMOGLOBIN: 12.4 g/dL (ref 12.0–15.0)
MCH: 28.3 pg (ref 26.0–34.0)
MCHC: 32.1 g/dL (ref 30.0–36.0)
MCV: 88.1 fL (ref 78.0–100.0)
Platelets: 229 10*3/uL (ref 150–400)
RBC: 4.38 MIL/uL (ref 3.87–5.11)
RDW: 12.7 % (ref 11.5–15.5)
WBC: 6.3 10*3/uL (ref 4.0–10.5)

## 2014-04-27 SURGERY — SALPINGECTOMY, BILATERAL, LAPAROSCOPIC
Anesthesia: General | Site: Vagina

## 2014-04-27 MED ORDER — PROMETHAZINE HCL 25 MG/ML IJ SOLN
INTRAMUSCULAR | Status: AC
  Filled 2014-04-27: qty 1

## 2014-04-27 MED ORDER — IBUPROFEN 800 MG PO TABS: 800.0000 mg | ORAL_TABLET | Freq: Three times a day (TID) | ORAL | Status: AC | PRN

## 2014-04-27 MED ORDER — BUPIVACAINE-EPINEPHRINE 0.5% -1:200000 IJ SOLN
INTRAMUSCULAR | Status: DC | PRN
  Administered 2014-04-27: 9 mL

## 2014-04-27 MED ORDER — DEXAMETHASONE SODIUM PHOSPHATE 4 MG/ML IJ SOLN
INTRAMUSCULAR | Status: DC | PRN
  Administered 2014-04-27: 8 mg via INTRAVENOUS

## 2014-04-27 MED ORDER — FENTANYL CITRATE 0.05 MG/ML IJ SOLN
25.0000 ug | INTRAMUSCULAR | Status: DC | PRN
  Filled 2014-04-27: qty 1

## 2014-04-27 MED ORDER — KETOROLAC TROMETHAMINE 10 MG PO TABS: 10.0000 mg | ORAL_TABLET | Freq: Four times a day (QID) | ORAL | Status: AC | PRN

## 2014-04-27 MED ORDER — LACTATED RINGERS IV SOLN
INTRAVENOUS | Status: DC
  Filled 2014-04-27: qty 1000

## 2014-04-27 MED ORDER — LACTATED RINGERS IR SOLN
Status: DC | PRN
  Administered 2014-04-27: 3000 mL

## 2014-04-27 MED ORDER — KETOROLAC TROMETHAMINE 30 MG/ML IJ SOLN
INTRAMUSCULAR | Status: DC | PRN
  Administered 2014-04-27: 30 mg via INTRAVENOUS

## 2014-04-27 MED ORDER — GLYCOPYRROLATE 0.2 MG/ML IJ SOLN
INTRAMUSCULAR | Status: DC | PRN
  Administered 2014-04-27: 0.3 mg via INTRAVENOUS

## 2014-04-27 MED ORDER — FENTANYL CITRATE 0.05 MG/ML IJ SOLN
INTRAMUSCULAR | Status: AC
  Filled 2014-04-27: qty 4

## 2014-04-27 MED ORDER — PROMETHAZINE HCL 25 MG/ML IJ SOLN
12.5000 mg | Freq: Once | INTRAMUSCULAR | Status: DC | PRN
  Filled 2014-04-27: qty 1

## 2014-04-27 MED ORDER — ACETAMINOPHEN 10 MG/ML IV SOLN
INTRAVENOUS | Status: DC | PRN
  Administered 2014-04-27: 1000 mg via INTRAVENOUS

## 2014-04-27 MED ORDER — ROCURONIUM BROMIDE 100 MG/10ML IV SOLN
INTRAVENOUS | Status: DC | PRN
  Administered 2014-04-27: 10 mg via INTRAVENOUS
  Administered 2014-04-27: 30 mg via INTRAVENOUS

## 2014-04-27 MED ORDER — ONDANSETRON HCL 4 MG/2ML IJ SOLN
INTRAMUSCULAR | Status: DC | PRN
  Administered 2014-04-27: 4 mg via INTRAVENOUS

## 2014-04-27 MED ORDER — LIDOCAINE HCL (CARDIAC) 20 MG/ML IV SOLN
INTRAVENOUS | Status: DC | PRN
  Administered 2014-04-27: 60 mg via INTRAVENOUS

## 2014-04-27 MED ORDER — LACTATED RINGERS IV SOLN
INTRAVENOUS | Status: DC
  Administered 2014-04-27: 14:00:00 via INTRAVENOUS
  Filled 2014-04-27: qty 1000

## 2014-04-27 MED ORDER — PROMETHAZINE HCL 12.5 MG PO TABS: 12.5000 mg | ORAL_TABLET | Freq: Four times a day (QID) | ORAL | Status: AC | PRN

## 2014-04-27 MED ORDER — SCOPOLAMINE 1 MG/3DAYS TD PT72
1.0000 | MEDICATED_PATCH | TRANSDERMAL | Status: DC
  Administered 2014-04-27: 1.5 mg via TRANSDERMAL
  Filled 2014-04-27: qty 1

## 2014-04-27 MED ORDER — PROMETHAZINE HCL 25 MG/ML IJ SOLN
12.5000 mg | Freq: Once | INTRAMUSCULAR | Status: AC | PRN
  Administered 2014-04-27: 6.25 mg via INTRAVENOUS
  Filled 2014-04-27: qty 1

## 2014-04-27 MED ORDER — NEOSTIGMINE METHYLSULFATE 10 MG/10ML IV SOLN
INTRAVENOUS | Status: DC | PRN
  Administered 2014-04-27: 3 mg via INTRAVENOUS

## 2014-04-27 MED ORDER — SODIUM CHLORIDE 0.9 % IR SOLN
Status: DC | PRN
  Administered 2014-04-27: 500 mL

## 2014-04-27 MED ORDER — SCOPOLAMINE 1 MG/3DAYS TD PT72
MEDICATED_PATCH | TRANSDERMAL | Status: AC
  Filled 2014-04-27: qty 1

## 2014-04-27 MED ORDER — FENTANYL CITRATE 0.05 MG/ML IJ SOLN
INTRAMUSCULAR | Status: DC | PRN
  Administered 2014-04-27 (×3): 50 ug via INTRAVENOUS

## 2014-04-27 MED ORDER — PROMETHAZINE HCL 25 MG/ML IJ SOLN
6.2500 mg | Freq: Once | INTRAMUSCULAR | Status: AC
  Administered 2014-04-27: 6.25 mg via INTRAVENOUS
  Filled 2014-04-27: qty 1

## 2014-04-27 MED ORDER — PROPOFOL 10 MG/ML IV BOLUS
INTRAVENOUS | Status: DC | PRN
  Administered 2014-04-27: 150 mg via INTRAVENOUS
  Administered 2014-04-27: 50 mg via INTRAVENOUS

## 2014-04-27 MED ORDER — MIDAZOLAM HCL 5 MG/5ML IJ SOLN
INTRAMUSCULAR | Status: DC | PRN
  Administered 2014-04-27: 2 mg via INTRAVENOUS

## 2014-04-27 MED ORDER — MIDAZOLAM HCL 2 MG/2ML IJ SOLN
INTRAMUSCULAR | Status: AC
Start: 2014-04-27 — End: 2014-04-27
  Filled 2014-04-27: qty 2

## 2014-04-27 MED ORDER — ERYTHROMYCIN 5 MG/GM OP OINT
TOPICAL_OINTMENT | Freq: Once | OPHTHALMIC | Status: DC
  Filled 2014-04-27 (×2): qty 3.5

## 2014-04-27 SURGICAL SUPPLY — 40 items
ABLATOR ENDOMETRIAL BIPOLAR (ABLATOR) ×4 IMPLANT
AQUILEX TUBING ×4 IMPLANT
BLADE SURG 11 STRL SS (BLADE) ×8 IMPLANT
CATH ROBINSON RED A/P 16FR (CATHETERS) ×4 IMPLANT
CHLORAPREP W/TINT 26ML (MISCELLANEOUS) ×4 IMPLANT
CLOSURE WOUND 1/4 X3 (GAUZE/BANDAGES/DRESSINGS) ×1
CLOSURE WOUND 1/4X4 (GAUZE/BANDAGES/DRESSINGS) ×1
COVER MAYO STAND STRL (DRAPES) ×4 IMPLANT
COVER TABLE BACK 60X90 (DRAPES) ×4 IMPLANT
DRAPE HYSTEROSCOPY (DRAPE) ×4 IMPLANT
DRSG TELFA 3X8 NADH (GAUZE/BANDAGES/DRESSINGS) ×4 IMPLANT
GAUZE VASELINE 1X8 (GAUZE/BANDAGES/DRESSINGS) ×4 IMPLANT
GLOVE BIOGEL PI IND STRL 8.5 (GLOVE) ×2 IMPLANT
GLOVE BIOGEL PI INDICATOR 8.5 (GLOVE) ×2
GLOVE ECLIPSE 8.0 STRL XLNG CF (GLOVE) ×8 IMPLANT
GOWN STRL REUS W/TWL LRG LVL3 (GOWN DISPOSABLE) ×4 IMPLANT
GOWN STRL REUS W/TWL XL LVL3 (GOWN DISPOSABLE) ×4 IMPLANT
IV LACTATED RINGER IRRG 3000ML (IV SOLUTION) ×2
IV LR IRRIG 3000ML ARTHROMATIC (IV SOLUTION) ×2 IMPLANT
LEGGING LITHOTOMY PAIR STRL (DRAPES) ×4 IMPLANT
LIQUID BAND (GAUZE/BANDAGES/DRESSINGS) ×4 IMPLANT
NEEDLE HYPO 25X1 1.5 SAFETY (NEEDLE) ×4 IMPLANT
NOVASURE ×4 IMPLANT
NS IRRIG 500ML POUR BTL (IV SOLUTION) ×4 IMPLANT
OUTFLOW TUBING ×4 IMPLANT
PACK BASIN DAY SURGERY FS (CUSTOM PROCEDURE TRAY) ×4 IMPLANT
PACK LAPAROSCOPY II (CUSTOM PROCEDURE TRAY) ×8 IMPLANT
PAD OB MATERNITY 4.3X12.25 (PERSONAL CARE ITEMS) ×4 IMPLANT
PAD PREP 24X48 CUFFED NSTRL (MISCELLANEOUS) ×4 IMPLANT
SOLUTION ANTI FOG 6CC (MISCELLANEOUS) ×4 IMPLANT
STRIP CLOSURE SKIN 1/4X3 (GAUZE/BANDAGES/DRESSINGS) ×3 IMPLANT
STRIP CLOSURE SKIN 1/4X4 (GAUZE/BANDAGES/DRESSINGS) ×3 IMPLANT
SUT MNCRL AB 3-0 PS2 27 (SUTURE) ×4 IMPLANT
SUT VIC AB 2-0 CT1 (SUTURE) ×8 IMPLANT
SUT VIC AB 2-0 UR6 27 (SUTURE) ×4 IMPLANT
SYR CONTROL 10ML LL (SYRINGE) ×4 IMPLANT
TOWEL OR 17X24 6PK STRL BLUE (TOWEL DISPOSABLE) ×8 IMPLANT
TRAY DSU PREP LF (CUSTOM PROCEDURE TRAY) ×4 IMPLANT
TUBING INSUFFLATION W/FILTER (TUBING) ×4 IMPLANT
WARMER LAPAROSCOPE (MISCELLANEOUS) ×4 IMPLANT

## 2014-04-27 NOTE — Transfer of Care (Signed)
Immediate Anesthesia Transfer of Care Note  Patient: Kimberly Holt  Procedure(s) Performed: Procedure(s) (LRB): LAPAROSCOPIC BILATERAL SALPINGECTOMY (Bilateral) DILATATION & CURETTAGE/HYSTEROSCOPY WITH NOVASURE ABLATION (N/A) PERINEOPLASTY (N/A)  Patient Location: PACU  Anesthesia Type: General  Level of Consciousness: awake, oriented, sedated and patient cooperative  Airway & Oxygen Therapy: Patient Spontanous Breathing and Patient connected to face mask oxygen  Post-op Assessment: Report given to PACU RN and Post -op Vital signs reviewed and stable  Post vital signs: Reviewed and stable  Complications: No apparent anesthesia complications

## 2014-04-27 NOTE — Discharge Instructions (Signed)
Diagnostic Laparoscopy Laparoscopy is a surgical procedure. It is used to diagnose and treat diseases inside the belly (abdomen). It is usually a brief, common, and relatively simple procedure. The laparoscopeis a thin, lighted, pencil-sized instrument. It is like a telescope. It is inserted into your abdomen through a small cut (incision). Your caregiver can look at the organs inside your body through this instrument. He or she can see if there is anything abnormal. Laparoscopy can be done either in a hospital or outpatient clinic. You may be given a mild sedative to help you relax before the procedure. Once in the operating room, you will be given a drug to make you sleep (general anesthesia). Laparoscopy usually lasts less than 1 hour. After the procedure, you will be monitored in a recovery area until you are stable and doing well. Once you are home, it will take 2 to 3 days to fully recover. RISKS AND COMPLICATIONS  Laparoscopy has relatively few risks. Your caregiver will discuss the risks with you before the procedure. Some problems that can occur include:  Infection.  Bleeding.  Damage to other organs.  Anesthetic side effects. PROCEDURE Once you receive anesthesia, your surgeon inflates the abdomen with a harmless gas (carbon dioxide). This makes the organs easier to see. The laparoscope is inserted into the abdomen through a small incision. This allows your surgeon to see into the abdomen. Other small instruments are also inserted into the abdomen through other small openings. Many surgeons attach a video camera to the laparoscope to enlarge the view. During a diagnostic laparoscopy, the surgeon may be looking for inflammation, infection, or cancer. Your surgeon may take tissue samples(biopsies). The samples are sent to a specialist in looking at cells and tissue samples (pathologist). The pathologist examines them under a microscope. Biopsies can help to diagnose or confirm a  disease. AFTER THE PROCEDURE   The gas is released from inside the abdomen.  The incisions are closed with stitches (sutures). Because these incisions are small (usually less than 1/2 inch), there is usually minimal discomfort after the procedure. There may be some mild discomfort in the throat. This is from the tube placed in the throat while you were sleeping. You may have some mild abdominal discomfort. There may also be discomfort from the instrument placement incisions in the abdomen.  The recovery time is shortened as long as there are no complications.  You will rest in a recovery room until stable and doing well. As long as there are no complications, you may be allowed to go home. FINDING OUT THE RESULTS OF YOUR TEST Not all test results are available during your visit. If your test results are not back during the visit, make an appointment with your caregiver to find out the results. Do not assume everything is normal if you have not heard from your caregiver or the medical facility. It is important for you to follow up on all of your test results. HOME CARE INSTRUCTIONS   Take all medicines as directed.  Only take over-the-counter or prescription medicines for pain, discomfort, or fever as directed by your caregiver.  Resume daily activities as directed.  Showers are preferred over baths.  You may resume sexual activities in 1 week or as directed.  Do not drive while taking narcotics. SEEK MEDICAL CARE IF:   There is increasing abdominal pain.  There is new pain in the shoulders (shoulder strap areas).  You feel lightheaded or faint.  You have the chills.  You or  your child has an oral temperature above 102° F (38.9° C). °· There is pus-like (purulent) drainage from any of the wounds. °· You are unable to pass gas or have a bowel movement. °· You feel sick to your stomach (nauseous) or throw up (vomit). °MAKE SURE YOU:  °· Understand these instructions. °· Will watch  your condition. °· Will get help right away if you are not doing well or get worse. °Document Released: 08/06/2000 Document Revised: 08/25/2012 Document Reviewed: 04/30/2007 °ExitCare® Patient Information ©2015 [employer]. This information is not intended to replace advice given to you by your health care provider. Make sure you discuss any questions you have with your health care provider. ° °Post Anesthesia Home Care Instructions ° °Activity: °Get plenty of rest for the remainder of the day. A responsible adult should stay with you for 24 hours following the procedure.  °For the next 24 hours, DO NOT: °-Drive a car °-Operate machinery °-Drink alcoholic beverages °-Take any medication unless instructed by your physician °-Make any legal decisions or sign important papers. ° °Meals: °Start with liquid foods such as gelatin or soup. Progress to regular foods as tolerated. Avoid greasy, spicy, heavy foods. If nausea and/or vomiting occur, drink only clear liquids until the nausea and/or vomiting subsides. Call your physician if vomiting continues. ° °Special Instructions/Symptoms: °Your throat may feel dry or sore from the anesthesia or the breathing tube placed in your throat during surgery. If this causes discomfort, gargle with warm salt water. The discomfort should disappear within 24 hours. ° °

## 2014-04-27 NOTE — Anesthesia Postprocedure Evaluation (Addendum)
  Anesthesia Post-op Note  Patient: Kimberly Holt  Procedure(s) Performed: Procedure(s) (LRB): LAPAROSCOPIC BILATERAL SALPINGECTOMY (Bilateral) DILATATION & CURETTAGE/HYSTEROSCOPY WITH NOVASURE ABLATION (N/A) PERINEOPLASTY (N/A)  Patient Location: PACU  Anesthesia Type: General  Level of Consciousness: awake and alert   Airway and Oxygen Therapy: Patient Spontanous Breathing  Post-op Pain: mild  Post-op Assessment: Post-op Vital signs reviewed, Patient's Cardiovascular Status Stable, Respiratory Function Stable, Patent Airway and No signs of Nausea or vomiting  Last Vitals:  Filed Vitals:   04/27/14 1745  BP: 90/50  Pulse: 61  Temp:   Resp: 18    Post-op Vital Signs: stable   Complications: Possible corneal abrasion. Patient in PACU with pain in her left eye. She doesn't want to open her eye and when she does it is red with no apparent foreign body. She reports that she "feels like something is in there". I spoke with the opthomologist on call Dr. Midge Aver who gave instructions to send the patient home with erythromycin eye ointment to be use three times daily and that she can come by the office tomorrow at [facility] to be formally evaluated. The patient was agreeable to the eye ointment but refuses to go get optho follow up. She says that she doesn't want to do anything tomorrow but rest. I expressed to her my concerns and that it is mine and the opthomologists recommendation that she be seen tomorrow. She again refused and I asked that if she can't travel back to [place] that at least she go see someone closer to her. She reports that she may go Friday.

## 2014-04-27 NOTE — Anesthesia Procedure Notes (Signed)
Procedure Name: Intubation Date/Time: 04/27/2014 3:16 PM Performed by: Denna Haggard D Pre-anesthesia Checklist: Patient identified, Timeout performed, Emergency Drugs available, Suction available and Patient being monitored Patient Re-evaluated:Patient Re-evaluated prior to inductionOxygen Delivery Method: Circle system utilized Preoxygenation: Pre-oxygenation with 100% oxygen Intubation Type: IV induction Ventilation: Mask ventilation without difficulty Laryngoscope Size: Mac and 3 Grade View: Grade II Tube type: Oral Number of attempts: 1 Airway Equipment and Method: Stylet and Bite block Placement Confirmation: breath sounds checked- equal and bilateral and positive ETCO2 Secured at: 21 cm Tube secured with: Tape Dental Injury: Teeth and Oropharynx as per pre-operative assessment

## 2014-04-27 NOTE — Op Note (Signed)
OPERATIVE NOTE  Kimberly Holt  DOB:    11-Oct-1975  MRN:    [id number]  CSN:    [id number]  Date of Surgery:  04/27/2014  Preoperative Diagnosis:  Desires sterilization Menorrhagia Vulvar lesion Dyspareunia  Postoperative Diagnosis:  Desires sterilization  Menorrhagia Vulvar lesion Dyspareunia  Procedure:  Tubal sterilization procedure via laparoscopic bilateral salpingectomy Hysteroscopy Dilation and curettage NovaSure ablation of the endometrium Perineoplasty with removal of right vulvar lesion  Surgeon:  Gildardo Cranker, M.D.  Assistant:  None  Anesthetic:  General  Disposition:  Kimberly Holt is a 38 y.o. year old female,G6P2032, who presents for a tubal sterilization procedure and the additional procedures noted above. She understands the indications for her operation as well as the alternative treatment options. She accepts the risk of, but not limited to, anesthetic complications, bleeding, infections, possible damage to the surrounding organs, and possible tubal failure (17 per 1000). The patient elects bilateral salpingectomy because of the potential reduction of cancer risk.  Findings:  The uterus, fallopian tubes, and ovaries were normal. The bowel and the liver appeared normal. No lesions were seen on hysteroscopy. The patient had a raised inclusion cysts on the right labia majora. This is the area that the patient says causes her discomfort.  Procedure:  The patient was taken to the operating room where a general anesthetic was given. The patient's abdomen, perineum, and vagina were prepped with sterile solution. The bladder was drained of urine. An examination under anesthesia was performed. A Hulka tenaculum was placed inside the uterus. The patient was sterilely draped. The subumbilical area was injected with half percent Marcaine with epinephrine. A subumbilical incision was made and the Veress needle was inserted into the abdominal  cavity without difficulty. Proper placement was confirmed using the fluid drop test. A pneumoperitoneum was obtained. The laparoscopic trocar and the laparoscope were substituted for the Veress needle. The pelvic organs were inspected with findings as mentioned above. Half percent Marcaine with epinephrine was injected in the left lower quadrant. A 5 mm trocar was placed and the abdominal cavity under direct visualization. The right fallopian tube was identified and followed to the fimbriated end. The mesosalpinx of the right fallopian tube was cauterized until it was completely desiccated. The right fallopian tube was then cauterized approximately 1 cm from the uterus. The right fallopian tube was excised. Hemostasis was adequate. An identical procedure was carried out on the opposite side. Again hemostasis was adequate. The fallopian tubes were removed through the umbilical port. The pneumoperitoneum was allowed to escape. All instruments were removed. The subumbilical fascia was closed using 0 Vicryl. The skin was reapproximated using 3-0 Monocryl. The patient was then placed in a more lithotomy position. The cervix was measured to be 3 cm. The uterus sounded to 8.5 cm. The diagnostic hysteroscope was inserted into the uterus. The cavity was carefully inspected. Pictures were taken. No pathology was appreciated. The hysteroscope was removed. The fluid deficit was 80 cc. The cavity was then curetted using a small curette. The NovaSure instrument was then tested. It function properly. The NovaSure was placed inside the uterine cavity. The cavity width was noted to be 4.57 m. The power was noted to be 136 W. A test procedure was performed and the uterus was noted to be intact. The NovaSure instrument was then activated and the cavity was ablated for 85 seconds. The patient tolerated her procedure well. The NovaSure instrument was removed. The patient was noted to have bleeding from her cervix.  Figure-of-eight  sutures of 2-0 Vicryl placed in the cervix for hemostasis. The inclusion cyst on the right labia was injected with 2 cc of half percent Marcaine with epinephrine. The inclusion cyst was sharply removed with the scalpel. A figure-of-eight suture of 2-0 Vicryl was placed to reapproximate the skin. Sponge, instrument, and needle counts were correct. The estimated blood loss was less than 40 cc. The patient tolerated her procedure well. She was awakened from her anesthetic without difficulty. She was transported to the recovery room in stable condition. The fallopian tubes, and endometrial curettings, and lesion from the right labia were sent to pathology.   Followup instructions:  The patient will return to see Dr. Raphael Gibney in 2 weeks. She was given a copy of the postoperative instructions as prepared by the [facility] for patients who've undergone laparoscopy.  Discharge medications:  Motrin 800 mg tablets            One tablet every 8 hours as needed for moderate pain. Toradol 10 mg tablets             One tablet every 6 hours as needed for severe pain. Phenergan 12.5 mg tablets   One tablet every 6 hours as needed for nausea.  Gildardo Cranker, M.D.

## 2014-04-27 NOTE — Anesthesia Preprocedure Evaluation (Addendum)
Anesthesia Evaluation  Patient identified by MRN, date of birth, ID band Patient awake    Reviewed: Allergy & Precautions, H&P , NPO status , Patient's Chart, lab work & pertinent test results  History of Anesthesia Complications (+) PONV  Airway Mallampati: I  TM Distance: >3 FB Neck ROM: Full    Dental no notable dental hx. (+) Teeth Intact, Dental Advisory Given   Pulmonary  History acute pulmonary edema after child birth breath sounds clear to auscultation  Pulmonary exam normal       Cardiovascular Exercise Tolerance: Good negative cardio ROS  Rhythm:Regular Rate:Normal     Neuro/Psych  Headaches, Anxiety negative neurological ROS  negative psych ROS   GI/Hepatic negative GI ROS, Neg liver ROS,   Endo/Other  negative endocrine ROS  Renal/GU negative Renal ROS  negative genitourinary   Musculoskeletal negative musculoskeletal ROS (+)   Abdominal   Peds negative pediatric ROS (+)  Hematology negative hematology ROS (+)   Anesthesia Other Findings   Reproductive/Obstetrics negative OB ROS                             Anesthesia Physical Anesthesia Plan  ASA: II  Anesthesia Plan: General   Post-op Pain Management:    Induction: Intravenous  Airway Management Planned: Oral ETT  Additional Equipment:   Intra-op Plan:   Post-operative Plan: Extubation in OR  Informed Consent: I have reviewed the patients History and Physical, chart, labs and discussed the procedure including the risks, benefits and alternatives for the proposed anesthesia with the patient or authorized representative who has indicated his/her understanding and acceptance.   Dental Advisory Given  Plan Discussed with: CRNA and Surgeon  Anesthesia Plan Comments:         Anesthesia Quick Evaluation

## 2014-04-27 NOTE — H&P (Signed)
The patient was interviewed and examined today.  The previously documented history and physical examination was reviewed. There are no changes. The operative procedure was reviewed. The risks and benefits were outlined again. The specific risks include, but are not limited to, anesthetic complications, bleeding, infections, and possible damage to the surrounding organs. The patient's questions were answered.  We are ready to proceed as outlined. The likelihood of the patient achieving the goals of this procedure is very likely.   BP 123/68 mmHg  Pulse 83  Temp(Src) 98.4 F (36.9 C) (Oral)  Resp 16  Ht 5' 8.5" (1.74 m)  Wt 173 lb 8 oz (78.699 kg)  BMI 25.99 kg/m2  SpO2 100%  LMP 04/01/2014 (Exact Date)  Breastfeeding? Yes  Results for orders placed or performed during the hospital encounter of 04/27/14 (from the past 24 hour(s))  CBC     Status: None   Collection Time: 04/27/14  1:30 PM  Result Value Ref Range   WBC 6.3 4.0 - 10.5 K/uL   RBC 4.38 3.87 - 5.11 MIL/uL   Hemoglobin 12.4 12.0 - 15.0 g/dL   HCT 38.6 36.0 - 46.0 %   MCV 88.1 78.0 - 100.0 fL   MCH 28.3 26.0 - 34.0 pg   MCHC 32.1 30.0 - 36.0 g/dL   RDW 12.7 11.5 - 15.5 %   Platelets 229 150 - 400 K/uL  Pregnancy, urine POC     Status: None   Collection Time: 04/27/14  1:42 PM  Result Value Ref Range   Preg Test, Ur NEGATIVE NEGATIVE   Gildardo Cranker, M.D.

## 2014-04-28 ENCOUNTER — Encounter (HOSPITAL_BASED_OUTPATIENT_CLINIC_OR_DEPARTMENT_OTHER): Payer: Self-pay | Admitting: Obstetrics and Gynecology

## 2015-01-18 ENCOUNTER — Emergency Department (HOSPITAL_COMMUNITY)
Admission: EM | Admit: 2015-01-18 | Discharge: 2015-01-18 | Disposition: A | Discharge status: Home / Self Care | Payer: BLUE CROSS/BLUE SHIELD | Attending: Emergency Medicine | Admitting: Emergency Medicine

## 2015-01-18 ENCOUNTER — Encounter (HOSPITAL_COMMUNITY): Payer: Self-pay

## 2015-01-18 DIAGNOSIS — Z87442 Personal history of urinary calculi: Secondary | ICD-10-CM | POA: Diagnosis not present

## 2015-01-18 DIAGNOSIS — Z8719 Personal history of other diseases of the digestive system: Secondary | ICD-10-CM | POA: Insufficient documentation

## 2015-01-18 DIAGNOSIS — G43909 Migraine, unspecified, not intractable, without status migrainosus: Secondary | ICD-10-CM | POA: Diagnosis present

## 2015-01-18 DIAGNOSIS — Z8619 Personal history of other infectious and parasitic diseases: Secondary | ICD-10-CM | POA: Diagnosis not present

## 2015-01-18 DIAGNOSIS — Z8659 Personal history of other mental and behavioral disorders: Secondary | ICD-10-CM | POA: Insufficient documentation

## 2015-01-18 DIAGNOSIS — Z8709 Personal history of other diseases of the respiratory system: Secondary | ICD-10-CM | POA: Diagnosis not present

## 2015-01-18 DIAGNOSIS — Z862 Personal history of diseases of the blood and blood-forming organs and certain disorders involving the immune mechanism: Secondary | ICD-10-CM | POA: Insufficient documentation

## 2015-01-18 DIAGNOSIS — Z79899 Other long term (current) drug therapy: Secondary | ICD-10-CM | POA: Diagnosis not present

## 2015-01-18 DIAGNOSIS — G43109 Migraine with aura, not intractable, without status migrainosus: Secondary | ICD-10-CM | POA: Diagnosis not present

## 2015-01-18 MED ORDER — ONDANSETRON HCL 4 MG/2ML IJ SOLN
4.0000 mg | Freq: Once | INTRAMUSCULAR | Status: AC
  Administered 2015-01-18: 4 mg via INTRAVENOUS

## 2015-01-18 MED ORDER — METOCLOPRAMIDE HCL 5 MG/ML IJ SOLN
10.0000 mg | Freq: Once | INTRAMUSCULAR | Status: AC
  Administered 2015-01-18: 10 mg via INTRAVENOUS
  Filled 2015-01-18: qty 2

## 2015-01-18 MED ORDER — DIPHENHYDRAMINE HCL 50 MG/ML IJ SOLN
25.0000 mg | Freq: Once | INTRAMUSCULAR | Status: AC
  Administered 2015-01-18: 25 mg via INTRAVENOUS
  Filled 2015-01-18: qty 1

## 2015-01-18 MED ORDER — KETOROLAC TROMETHAMINE 30 MG/ML IJ SOLN
30.0000 mg | Freq: Once | INTRAMUSCULAR | Status: AC
  Administered 2015-01-18: 30 mg via INTRAVENOUS
  Filled 2015-01-18: qty 1

## 2015-01-18 MED ORDER — SODIUM CHLORIDE 0.9 % IV SOLN
1000.0000 mL | Freq: Once | INTRAVENOUS | Status: AC
  Administered 2015-01-18: 1000 mL via INTRAVENOUS

## 2015-01-18 MED ORDER — ONDANSETRON HCL 4 MG/2ML IJ SOLN
4.0000 mg | Freq: Once | INTRAMUSCULAR | Status: DC
  Filled 2015-01-18: qty 2

## 2015-01-18 NOTE — ED Provider Notes (Signed)
CSN: [id number]     Arrival date & time 01/18/15  0023 History   First MD Initiated Contact with Patient 01/18/15 0125     Chief Complaint  Patient presents with  . Migraine     (Consider location/radiation/quality/duration/timing/severity/associated sxs/prior Treatment) HPI  Patient has a history of frequent headaches and states she only gets a bad headache very infrequently now, the last time was a few years ago. She states about 7 PM her head "didn't feel right" she cannot tell me what that means. She states she started having a frontal and temporal headache on the right side that she describes as sharp. She has had nausea with vomiting. She states she started seeing white spots which is typical of her migraines. She denies blurred vision. She denies numbness or to leave her extremities. She states she has sound and light sensitivity.  PCP Dr. Quillian Quince in [place]   Past Medical History  Diagnosis Date  . HSV infection   . HPV (human papilloma virus) infection   . History of anemia   . History of kidney stones 11/03/2012  . Anxiety   . Vaginal Pap smear, abnormal     rpt was WNL  . GERD (gastroesophageal reflux disease)   . PONV (postoperative nausea and vomiting)   . Migraines   . History of acute pulmonary edema     post portum  06-23-2013--  resolved with no residual   Past Surgical History  Procedure Laterality Date  . Cystoscopy/retrograde/ureteroscopy  10/05/2011    Procedure: CYSTOSCOPY/RETROGRADE/URETEROSCOPY;  Surgeon: Hanley Ben, MD;  Location: [facility];  Service: Urology;  Laterality: Right;  . Wisdom tooth extraction    . Appendectomy  AGE 27  . Laparoscopic bilateral salpingectomy Bilateral 04/27/2014    Procedure: LAPAROSCOPIC BILATERAL SALPINGECTOMY;  Surgeon: Ena Dawley, MD;  Location: [facility];  Service: Gynecology;  Laterality: Bilateral;  . Dilitation & currettage/hystroscopy with novasure ablation N/A 04/27/2014   Procedure: DILATATION & CURETTAGE/HYSTEROSCOPY WITH NOVASURE ABLATION;  Surgeon: Ena Dawley, MD;  Location: [facility];  Service: Gynecology;  Laterality: N/A;  . Perineoplasty N/A 04/27/2014    Procedure: PERINEOPLASTY;  Surgeon: Ena Dawley, MD;  Location: [facility];  Service: Gynecology;  Laterality: N/A;   Family History  Problem Relation Age of Onset  . Hypertension Father   . Heart disease Father     CAD  . Cancer Paternal Grandmother     lung cancer-smoked  . Cancer Maternal Grandmother     breast   . Diabetes Father   . Cancer Paternal Aunt     breast  . Heart disease Paternal Uncle    Social History  Substance Use Topics  . Smoking status: Never Smoker   . Smokeless tobacco: Never Used  . Alcohol Use: No   employed  OB History    Gravida Para Term Preterm AB TAB SAB Ectopic Multiple Living   6 3 2  3  3   2      Review of Systems  All other systems reviewed and are negative.     Allergies  Codeine and Hydrocodone  Home Medications   Prior to Admission medications   Medication Sig Start Date End Date Taking? Authorizing Provider  acetaminophen (TYLENOL) 500 MG tablet Take 1,000 mg by mouth every 6 (six) hours as needed.    Historical Provider, MD  ibuprofen (ADVIL,MOTRIN) 800 MG tablet Take 1 tablet (800 mg total) by mouth every 8 (eight) hours as needed.  04/27/14   Ena Dawley, MD  ketorolac (TORADOL) 10 MG tablet Take 1 tablet (10 mg total) by mouth every 6 (six) hours as needed. 04/27/14   Ena Dawley, MD  Prenatal Vit-Fe Fumarate-FA (PRENATAL MULTIVITAMIN) TABS tablet Take 1 tablet by mouth daily at 12 noon.    Historical Provider, MD  promethazine (PHENERGAN) 12.5 MG tablet Take 1 tablet (12.5 mg total) by mouth every 6 (six) hours as needed for nausea or vomiting. 04/27/14   Ena Dawley, MD   BP 129/84 mmHg  Pulse 96  Temp(Src) 97.5 F (36.4 C)  Resp 20  Ht 5\' 8"  (1.727 m)  Wt 170 lb (77.111 kg)   BMI 25.85 kg/m2  SpO2 97%  Vital signs normal   Physical Exam  Constitutional: She is oriented to person, place, and time. She appears well-developed and well-nourished.  Non-toxic appearance. She does not appear ill. She appears distressed.  Sitting in a dark room with her eyes closed  HENT:  Head: Normocephalic and atraumatic.  Right Ear: External ear normal.  Left Ear: External ear normal.  Nose: Nose normal. No mucosal edema or rhinorrhea.  Mouth/Throat: Oropharynx is clear and moist and mucous membranes are normal. No dental abscesses or uvula swelling.  Eyes: Conjunctivae and EOM are normal. Pupils are equal, round, and reactive to light.  Neck: Normal range of motion and full passive range of motion without pain. Neck supple.  Cardiovascular: Normal rate, regular rhythm and normal heart sounds.  Exam reveals no gallop and no friction rub.   No murmur heard. Pulmonary/Chest: Effort normal and breath sounds normal. No respiratory distress. She has no wheezes. She has no rhonchi. She has no rales. She exhibits no tenderness and no crepitus.  Abdominal: Soft. Normal appearance and bowel sounds are normal. She exhibits no distension. There is no tenderness. There is no rebound and no guarding.  Musculoskeletal: Normal range of motion. She exhibits no edema or tenderness.  Moves all extremities well.   Neurological: She is alert and oriented to person, place, and time. She has normal strength. No cranial nerve deficit.  Skin: Skin is warm, dry and intact. No rash noted. No erythema. No pallor.  Psychiatric: She has a normal mood and affect. Her speech is normal and behavior is normal. Her mood appears not anxious.  Nursing note and vitals reviewed.   ED Course  Procedures (including critical care time)  Medications  0.9 %  sodium chloride infusion (0 mLs Intravenous Stopped 01/18/15 0144)  ondansetron (ZOFRAN) injection 4 mg (4 mg Intravenous Given 01/18/15 0100)  metoCLOPramide  (REGLAN) injection 10 mg (10 mg Intravenous Given 01/18/15 0140)  diphenhydrAMINE (BENADRYL) injection 25 mg (25 mg Intravenous Given 01/18/15 0140)  ketorolac (TORADOL) 30 MG/ML injection 30 mg (30 mg Intravenous Given 01/18/15 0144)   Patient was given IV fluids and migraine cocktail medications for her migraine headache.   Recheck at 3 AM patient states her headache is gone. I am able to turn on the lights without causing pain. Patient feels ready to be discharged.    MDM   Final diagnoses:  Migraine with aura and without status migrainosus, not intractable    Plan discharge  Rolland Porter, MD, Barbette Or, MD 01/18/15 [phone]

## 2015-01-18 NOTE — ED Notes (Signed)
Nausea, vomiting, and headache per pt. Started feeling bad around 2030 and was swimmy headed.

## 2015-01-18 NOTE — Discharge Instructions (Signed)
Go home and rest. Recheck as needed.  ° ° °Recurrent Migraine Headache °A migraine headache is an intense, throbbing pain on one or both sides of your head. Recurrent migraines keep coming back. A migraine can last for 30 minutes to several hours. °CAUSES  °The exact cause of a migraine headache is not always known. However, a migraine may be caused when nerves in the brain become irritated and release chemicals that cause inflammation. This causes pain. °Certain things may also trigger migraines, such as:  °· Alcohol. °· Smoking. °· Stress. °· Menstruation. °· Aged cheeses. °· Foods or drinks that contain nitrates, glutamate, aspartame, or tyramine. °· Lack of sleep. °· Chocolate. °· Caffeine. °· Hunger. °· Physical exertion. °· Fatigue. °· Medicines used to treat chest pain (nitroglycerine), birth control pills, estrogen, and some blood pressure medicines. °SYMPTOMS  °· Pain on one or both sides of your head. °· Pulsating or throbbing pain. °· Severe pain that prevents daily activities. °· Pain that is aggravated by any physical activity. °· Nausea, vomiting, or both. °· Dizziness. °· Pain with exposure to bright lights, loud noises, or activity. °· General sensitivity to bright lights, loud noises, or smells. °Before you get a migraine, you may get warning signs that a migraine is coming (aura). An aura may include: °· Seeing flashing lights. °· Seeing bright spots, halos, or zigzag lines. °· Having tunnel vision or blurred vision. °· Having feelings of numbness or tingling. °· Having trouble talking. °· Having muscle weakness. °DIAGNOSIS  °A recurrent migraine headache is often diagnosed based on: °· Symptoms. °· Physical examination. °· A CT scan or MRI of your head. These imaging tests cannot diagnose migraines but can help rule out other causes of headaches.   °TREATMENT  °Medicines may be given for pain and nausea. Medicines can also be given to help prevent recurrent migraines. °HOME CARE  INSTRUCTIONS °· Only take over-the-counter or prescription medicines for pain or discomfort as directed by your health care provider. The use of long-term narcotics is not recommended. °· Lie down in a dark, quiet room when you have a migraine. °· Keep a journal to find out what may trigger your migraine headaches. For example, write down: °¨ What you eat and drink. °¨ How much sleep you get. °¨ Any change to your diet or medicines. °· Limit alcohol consumption. °· Quit smoking if you smoke. °· Get 7-9 hours of sleep, or as recommended by your health care provider. °· Limit stress. °· Keep lights dim if bright lights bother you and make your migraines worse. °SEEK MEDICAL CARE IF:  °· You do not get relief from the medicines given to you. °· You have a recurrence of pain. °· You have a fever. °SEEK IMMEDIATE MEDICAL CARE IF: °· Your migraine becomes severe. °· You have a stiff neck. °· You have loss of vision. °· You have muscular weakness or loss of muscle control. °· You start losing your balance or have trouble walking. °· You feel faint or pass out. °· You have severe symptoms that are different from your first symptoms. °MAKE SURE YOU:  °· Understand these instructions. °· Will watch your condition. °· Will get help right away if you are not doing well or get worse. °Document Released: 01/23/2001 Document Revised: 09/14/2013 Document Reviewed: 01/05/2013 °[employer]® Patient Information ©2015 [employer]. This information is not intended to replace advice given to you by your health care provider. Make sure you discuss any questions you have with your health care   provider. ° °

## 2016-07-06 DIAGNOSIS — Z0000 Encounter for general adult medical examination without abnormal findings: Secondary | ICD-10-CM | POA: Diagnosis not present

## 2016-07-10 DIAGNOSIS — Z0001 Encounter for general adult medical examination with abnormal findings: Secondary | ICD-10-CM | POA: Diagnosis not present

## 2016-07-25 DIAGNOSIS — Z1231 Encounter for screening mammogram for malignant neoplasm of breast: Secondary | ICD-10-CM | POA: Diagnosis not present

## 2016-07-25 DIAGNOSIS — Z124 Encounter for screening for malignant neoplasm of cervix: Secondary | ICD-10-CM | POA: Diagnosis not present

## 2016-07-25 DIAGNOSIS — Z01411 Encounter for gynecological examination (general) (routine) with abnormal findings: Secondary | ICD-10-CM | POA: Diagnosis not present

## 2016-08-02 DIAGNOSIS — Z1231 Encounter for screening mammogram for malignant neoplasm of breast: Secondary | ICD-10-CM | POA: Diagnosis not present

## 2016-08-07 ENCOUNTER — Other Ambulatory Visit: Payer: Self-pay | Admitting: Obstetrics and Gynecology

## 2016-08-07 DIAGNOSIS — R928 Other abnormal and inconclusive findings on diagnostic imaging of breast: Secondary | ICD-10-CM

## 2016-08-13 ENCOUNTER — Ambulatory Visit
Admission: RE | Admit: 2016-08-13 | Discharge: 2016-08-13 | Disposition: A | Discharge status: Home / Self Care | Payer: 59 | Source: Ambulatory Visit | Attending: Obstetrics and Gynecology | Admitting: Obstetrics and Gynecology

## 2016-08-13 DIAGNOSIS — R928 Other abnormal and inconclusive findings on diagnostic imaging of breast: Secondary | ICD-10-CM

## 2016-08-13 DIAGNOSIS — R922 Inconclusive mammogram: Secondary | ICD-10-CM | POA: Diagnosis not present

## 2016-08-13 DIAGNOSIS — N6011 Diffuse cystic mastopathy of right breast: Secondary | ICD-10-CM | POA: Diagnosis not present

## 2016-08-30 DIAGNOSIS — L814 Other melanin hyperpigmentation: Secondary | ICD-10-CM | POA: Diagnosis not present

## 2016-08-30 DIAGNOSIS — D1801 Hemangioma of skin and subcutaneous tissue: Secondary | ICD-10-CM | POA: Diagnosis not present

## 2016-08-30 DIAGNOSIS — L821 Other seborrheic keratosis: Secondary | ICD-10-CM | POA: Diagnosis not present

## 2016-10-04 DIAGNOSIS — R112 Nausea with vomiting, unspecified: Secondary | ICD-10-CM | POA: Diagnosis not present

## 2016-10-04 DIAGNOSIS — R42 Dizziness and giddiness: Secondary | ICD-10-CM | POA: Diagnosis not present

## 2017-02-23 DIAGNOSIS — Z23 Encounter for immunization: Secondary | ICD-10-CM | POA: Diagnosis not present

## 2017-04-09 DIAGNOSIS — D1801 Hemangioma of skin and subcutaneous tissue: Secondary | ICD-10-CM | POA: Diagnosis not present

## 2017-04-09 DIAGNOSIS — D225 Melanocytic nevi of trunk: Secondary | ICD-10-CM | POA: Diagnosis not present

## 2017-04-09 DIAGNOSIS — L814 Other melanin hyperpigmentation: Secondary | ICD-10-CM | POA: Diagnosis not present

## 2017-06-03 DIAGNOSIS — M9903 Segmental and somatic dysfunction of lumbar region: Secondary | ICD-10-CM | POA: Diagnosis not present

## 2017-06-03 DIAGNOSIS — M5136 Other intervertebral disc degeneration, lumbar region: Secondary | ICD-10-CM | POA: Diagnosis not present

## 2017-06-05 DIAGNOSIS — M5136 Other intervertebral disc degeneration, lumbar region: Secondary | ICD-10-CM | POA: Diagnosis not present

## 2017-06-05 DIAGNOSIS — M9903 Segmental and somatic dysfunction of lumbar region: Secondary | ICD-10-CM | POA: Diagnosis not present

## 2017-06-06 DIAGNOSIS — M9903 Segmental and somatic dysfunction of lumbar region: Secondary | ICD-10-CM | POA: Diagnosis not present

## 2017-06-06 DIAGNOSIS — M5136 Other intervertebral disc degeneration, lumbar region: Secondary | ICD-10-CM | POA: Diagnosis not present

## 2017-06-10 DIAGNOSIS — M5136 Other intervertebral disc degeneration, lumbar region: Secondary | ICD-10-CM | POA: Diagnosis not present

## 2017-06-10 DIAGNOSIS — M9903 Segmental and somatic dysfunction of lumbar region: Secondary | ICD-10-CM | POA: Diagnosis not present

## 2017-06-13 DIAGNOSIS — M5136 Other intervertebral disc degeneration, lumbar region: Secondary | ICD-10-CM | POA: Diagnosis not present

## 2017-06-13 DIAGNOSIS — M9903 Segmental and somatic dysfunction of lumbar region: Secondary | ICD-10-CM | POA: Diagnosis not present

## 2017-06-17 DIAGNOSIS — M5136 Other intervertebral disc degeneration, lumbar region: Secondary | ICD-10-CM | POA: Diagnosis not present

## 2017-06-17 DIAGNOSIS — M9903 Segmental and somatic dysfunction of lumbar region: Secondary | ICD-10-CM | POA: Diagnosis not present

## 2017-06-18 DIAGNOSIS — M9903 Segmental and somatic dysfunction of lumbar region: Secondary | ICD-10-CM | POA: Diagnosis not present

## 2017-06-18 DIAGNOSIS — M5136 Other intervertebral disc degeneration, lumbar region: Secondary | ICD-10-CM | POA: Diagnosis not present

## 2017-06-19 DIAGNOSIS — M9903 Segmental and somatic dysfunction of lumbar region: Secondary | ICD-10-CM | POA: Diagnosis not present

## 2017-06-19 DIAGNOSIS — M5136 Other intervertebral disc degeneration, lumbar region: Secondary | ICD-10-CM | POA: Diagnosis not present

## 2017-06-20 DIAGNOSIS — M9903 Segmental and somatic dysfunction of lumbar region: Secondary | ICD-10-CM | POA: Diagnosis not present

## 2017-06-20 DIAGNOSIS — M5136 Other intervertebral disc degeneration, lumbar region: Secondary | ICD-10-CM | POA: Diagnosis not present

## 2017-06-24 DIAGNOSIS — M9903 Segmental and somatic dysfunction of lumbar region: Secondary | ICD-10-CM | POA: Diagnosis not present

## 2017-06-24 DIAGNOSIS — M5136 Other intervertebral disc degeneration, lumbar region: Secondary | ICD-10-CM | POA: Diagnosis not present

## 2017-06-26 DIAGNOSIS — M5136 Other intervertebral disc degeneration, lumbar region: Secondary | ICD-10-CM | POA: Diagnosis not present

## 2017-06-26 DIAGNOSIS — M9903 Segmental and somatic dysfunction of lumbar region: Secondary | ICD-10-CM | POA: Diagnosis not present

## 2017-07-09 DIAGNOSIS — Z0001 Encounter for general adult medical examination with abnormal findings: Secondary | ICD-10-CM | POA: Diagnosis not present

## 2017-07-11 DIAGNOSIS — M9903 Segmental and somatic dysfunction of lumbar region: Secondary | ICD-10-CM | POA: Diagnosis not present

## 2017-07-11 DIAGNOSIS — M5136 Other intervertebral disc degeneration, lumbar region: Secondary | ICD-10-CM | POA: Diagnosis not present

## 2017-07-12 DIAGNOSIS — Z6824 Body mass index (BMI) 24.0-24.9, adult: Secondary | ICD-10-CM | POA: Diagnosis not present

## 2017-07-12 DIAGNOSIS — Z0001 Encounter for general adult medical examination with abnormal findings: Secondary | ICD-10-CM | POA: Diagnosis not present

## 2017-07-15 DIAGNOSIS — M9903 Segmental and somatic dysfunction of lumbar region: Secondary | ICD-10-CM | POA: Diagnosis not present

## 2017-07-15 DIAGNOSIS — M5136 Other intervertebral disc degeneration, lumbar region: Secondary | ICD-10-CM | POA: Diagnosis not present

## 2017-07-18 DIAGNOSIS — M5136 Other intervertebral disc degeneration, lumbar region: Secondary | ICD-10-CM | POA: Diagnosis not present

## 2017-07-18 DIAGNOSIS — M9903 Segmental and somatic dysfunction of lumbar region: Secondary | ICD-10-CM | POA: Diagnosis not present

## 2017-07-22 DIAGNOSIS — M5136 Other intervertebral disc degeneration, lumbar region: Secondary | ICD-10-CM | POA: Diagnosis not present

## 2017-07-22 DIAGNOSIS — M9903 Segmental and somatic dysfunction of lumbar region: Secondary | ICD-10-CM | POA: Diagnosis not present

## 2017-07-24 DIAGNOSIS — M9903 Segmental and somatic dysfunction of lumbar region: Secondary | ICD-10-CM | POA: Diagnosis not present

## 2017-07-24 DIAGNOSIS — M5136 Other intervertebral disc degeneration, lumbar region: Secondary | ICD-10-CM | POA: Diagnosis not present

## 2017-09-16 DIAGNOSIS — M9903 Segmental and somatic dysfunction of lumbar region: Secondary | ICD-10-CM | POA: Diagnosis not present

## 2017-09-16 DIAGNOSIS — M5136 Other intervertebral disc degeneration, lumbar region: Secondary | ICD-10-CM | POA: Diagnosis not present

## 2017-09-18 DIAGNOSIS — M9903 Segmental and somatic dysfunction of lumbar region: Secondary | ICD-10-CM | POA: Diagnosis not present

## 2017-09-18 DIAGNOSIS — M5136 Other intervertebral disc degeneration, lumbar region: Secondary | ICD-10-CM | POA: Diagnosis not present

## 2017-09-23 DIAGNOSIS — J020 Streptococcal pharyngitis: Secondary | ICD-10-CM | POA: Diagnosis not present

## 2017-09-26 DIAGNOSIS — M5136 Other intervertebral disc degeneration, lumbar region: Secondary | ICD-10-CM | POA: Diagnosis not present

## 2017-09-26 DIAGNOSIS — M9903 Segmental and somatic dysfunction of lumbar region: Secondary | ICD-10-CM | POA: Diagnosis not present

## 2017-10-01 DIAGNOSIS — M9903 Segmental and somatic dysfunction of lumbar region: Secondary | ICD-10-CM | POA: Diagnosis not present

## 2017-10-01 DIAGNOSIS — M5136 Other intervertebral disc degeneration, lumbar region: Secondary | ICD-10-CM | POA: Diagnosis not present

## 2017-10-02 DIAGNOSIS — M5136 Other intervertebral disc degeneration, lumbar region: Secondary | ICD-10-CM | POA: Diagnosis not present

## 2017-10-02 DIAGNOSIS — M9903 Segmental and somatic dysfunction of lumbar region: Secondary | ICD-10-CM | POA: Diagnosis not present

## 2017-10-16 DIAGNOSIS — M5136 Other intervertebral disc degeneration, lumbar region: Secondary | ICD-10-CM | POA: Diagnosis not present

## 2017-10-16 DIAGNOSIS — M9903 Segmental and somatic dysfunction of lumbar region: Secondary | ICD-10-CM | POA: Diagnosis not present

## 2017-10-23 DIAGNOSIS — M5136 Other intervertebral disc degeneration, lumbar region: Secondary | ICD-10-CM | POA: Diagnosis not present

## 2017-10-23 DIAGNOSIS — M9903 Segmental and somatic dysfunction of lumbar region: Secondary | ICD-10-CM | POA: Diagnosis not present

## 2017-10-30 DIAGNOSIS — M9903 Segmental and somatic dysfunction of lumbar region: Secondary | ICD-10-CM | POA: Diagnosis not present

## 2017-10-30 DIAGNOSIS — M5136 Other intervertebral disc degeneration, lumbar region: Secondary | ICD-10-CM | POA: Diagnosis not present

## 2017-11-06 DIAGNOSIS — M9903 Segmental and somatic dysfunction of lumbar region: Secondary | ICD-10-CM | POA: Diagnosis not present

## 2017-11-06 DIAGNOSIS — M5136 Other intervertebral disc degeneration, lumbar region: Secondary | ICD-10-CM | POA: Diagnosis not present

## 2017-11-12 DIAGNOSIS — J039 Acute tonsillitis, unspecified: Secondary | ICD-10-CM | POA: Diagnosis not present

## 2017-11-20 DIAGNOSIS — M5136 Other intervertebral disc degeneration, lumbar region: Secondary | ICD-10-CM | POA: Diagnosis not present

## 2017-11-20 DIAGNOSIS — M9903 Segmental and somatic dysfunction of lumbar region: Secondary | ICD-10-CM | POA: Diagnosis not present

## 2017-11-28 DIAGNOSIS — M9903 Segmental and somatic dysfunction of lumbar region: Secondary | ICD-10-CM | POA: Diagnosis not present

## 2017-11-28 DIAGNOSIS — M5136 Other intervertebral disc degeneration, lumbar region: Secondary | ICD-10-CM | POA: Diagnosis not present

## 2017-12-04 DIAGNOSIS — M9903 Segmental and somatic dysfunction of lumbar region: Secondary | ICD-10-CM | POA: Diagnosis not present

## 2017-12-04 DIAGNOSIS — M5136 Other intervertebral disc degeneration, lumbar region: Secondary | ICD-10-CM | POA: Diagnosis not present

## 2017-12-20 ENCOUNTER — Other Ambulatory Visit: Payer: Self-pay | Admitting: Obstetrics and Gynecology

## 2017-12-20 DIAGNOSIS — Z1231 Encounter for screening mammogram for malignant neoplasm of breast: Secondary | ICD-10-CM

## 2018-01-02 DIAGNOSIS — M5136 Other intervertebral disc degeneration, lumbar region: Secondary | ICD-10-CM | POA: Diagnosis not present

## 2018-01-02 DIAGNOSIS — M9903 Segmental and somatic dysfunction of lumbar region: Secondary | ICD-10-CM | POA: Diagnosis not present

## 2018-01-08 DIAGNOSIS — M5136 Other intervertebral disc degeneration, lumbar region: Secondary | ICD-10-CM | POA: Diagnosis not present

## 2018-01-08 DIAGNOSIS — M9903 Segmental and somatic dysfunction of lumbar region: Secondary | ICD-10-CM | POA: Diagnosis not present

## 2018-01-16 DIAGNOSIS — M5136 Other intervertebral disc degeneration, lumbar region: Secondary | ICD-10-CM | POA: Diagnosis not present

## 2018-01-16 DIAGNOSIS — M9903 Segmental and somatic dysfunction of lumbar region: Secondary | ICD-10-CM | POA: Diagnosis not present

## 2018-01-21 ENCOUNTER — Ambulatory Visit
Admission: RE | Admit: 2018-01-21 | Discharge: 2018-01-21 | Disposition: A | Discharge status: Home / Self Care | Payer: 59 | Source: Ambulatory Visit | Attending: Obstetrics and Gynecology | Admitting: Obstetrics and Gynecology

## 2018-01-21 DIAGNOSIS — Z1231 Encounter for screening mammogram for malignant neoplasm of breast: Secondary | ICD-10-CM | POA: Diagnosis not present

## 2018-01-22 DIAGNOSIS — M5136 Other intervertebral disc degeneration, lumbar region: Secondary | ICD-10-CM | POA: Diagnosis not present

## 2018-01-22 DIAGNOSIS — M9903 Segmental and somatic dysfunction of lumbar region: Secondary | ICD-10-CM | POA: Diagnosis not present

## 2018-01-28 DIAGNOSIS — M9903 Segmental and somatic dysfunction of lumbar region: Secondary | ICD-10-CM | POA: Diagnosis not present

## 2018-01-28 DIAGNOSIS — M5136 Other intervertebral disc degeneration, lumbar region: Secondary | ICD-10-CM | POA: Diagnosis not present

## 2018-02-06 DIAGNOSIS — M5136 Other intervertebral disc degeneration, lumbar region: Secondary | ICD-10-CM | POA: Diagnosis not present

## 2018-02-06 DIAGNOSIS — M9903 Segmental and somatic dysfunction of lumbar region: Secondary | ICD-10-CM | POA: Diagnosis not present

## 2018-02-12 DIAGNOSIS — M9903 Segmental and somatic dysfunction of lumbar region: Secondary | ICD-10-CM | POA: Diagnosis not present

## 2018-02-12 DIAGNOSIS — M5136 Other intervertebral disc degeneration, lumbar region: Secondary | ICD-10-CM | POA: Diagnosis not present

## 2018-02-13 DIAGNOSIS — M5136 Other intervertebral disc degeneration, lumbar region: Secondary | ICD-10-CM | POA: Diagnosis not present

## 2018-02-13 DIAGNOSIS — M9903 Segmental and somatic dysfunction of lumbar region: Secondary | ICD-10-CM | POA: Diagnosis not present

## 2018-02-17 DIAGNOSIS — M5136 Other intervertebral disc degeneration, lumbar region: Secondary | ICD-10-CM | POA: Diagnosis not present

## 2018-02-17 DIAGNOSIS — M9903 Segmental and somatic dysfunction of lumbar region: Secondary | ICD-10-CM | POA: Diagnosis not present

## 2018-02-24 DIAGNOSIS — M9903 Segmental and somatic dysfunction of lumbar region: Secondary | ICD-10-CM | POA: Diagnosis not present

## 2018-02-24 DIAGNOSIS — M5136 Other intervertebral disc degeneration, lumbar region: Secondary | ICD-10-CM | POA: Diagnosis not present

## 2018-02-27 DIAGNOSIS — Z23 Encounter for immunization: Secondary | ICD-10-CM | POA: Diagnosis not present

## 2018-03-04 DIAGNOSIS — Z6826 Body mass index (BMI) 26.0-26.9, adult: Secondary | ICD-10-CM | POA: Diagnosis not present

## 2018-03-10 DIAGNOSIS — M5136 Other intervertebral disc degeneration, lumbar region: Secondary | ICD-10-CM | POA: Diagnosis not present

## 2018-03-10 DIAGNOSIS — M9903 Segmental and somatic dysfunction of lumbar region: Secondary | ICD-10-CM | POA: Diagnosis not present

## 2018-03-26 DIAGNOSIS — M9903 Segmental and somatic dysfunction of lumbar region: Secondary | ICD-10-CM | POA: Diagnosis not present

## 2018-03-26 DIAGNOSIS — M5136 Other intervertebral disc degeneration, lumbar region: Secondary | ICD-10-CM | POA: Diagnosis not present

## 2018-04-07 DIAGNOSIS — Z124 Encounter for screening for malignant neoplasm of cervix: Secondary | ICD-10-CM | POA: Diagnosis not present

## 2018-04-07 DIAGNOSIS — Z01411 Encounter for gynecological examination (general) (routine) with abnormal findings: Secondary | ICD-10-CM | POA: Diagnosis not present

## 2018-04-07 DIAGNOSIS — Z6827 Body mass index (BMI) 27.0-27.9, adult: Secondary | ICD-10-CM | POA: Diagnosis not present

## 2018-04-07 DIAGNOSIS — Z72820 Sleep deprivation: Secondary | ICD-10-CM | POA: Diagnosis not present

## 2018-04-08 DIAGNOSIS — M5136 Other intervertebral disc degeneration, lumbar region: Secondary | ICD-10-CM | POA: Diagnosis not present

## 2018-04-08 DIAGNOSIS — M9903 Segmental and somatic dysfunction of lumbar region: Secondary | ICD-10-CM | POA: Diagnosis not present

## 2018-04-09 DIAGNOSIS — Z6826 Body mass index (BMI) 26.0-26.9, adult: Secondary | ICD-10-CM | POA: Diagnosis not present

## 2018-04-15 DIAGNOSIS — D225 Melanocytic nevi of trunk: Secondary | ICD-10-CM | POA: Diagnosis not present

## 2018-04-15 DIAGNOSIS — M5136 Other intervertebral disc degeneration, lumbar region: Secondary | ICD-10-CM | POA: Diagnosis not present

## 2018-04-15 DIAGNOSIS — D229 Melanocytic nevi, unspecified: Secondary | ICD-10-CM | POA: Diagnosis not present

## 2018-04-15 DIAGNOSIS — L700 Acne vulgaris: Secondary | ICD-10-CM | POA: Diagnosis not present

## 2018-04-15 DIAGNOSIS — M9903 Segmental and somatic dysfunction of lumbar region: Secondary | ICD-10-CM | POA: Diagnosis not present

## 2018-04-23 DIAGNOSIS — M9903 Segmental and somatic dysfunction of lumbar region: Secondary | ICD-10-CM | POA: Diagnosis not present

## 2018-04-23 DIAGNOSIS — M5136 Other intervertebral disc degeneration, lumbar region: Secondary | ICD-10-CM | POA: Diagnosis not present

## 2018-04-30 DIAGNOSIS — M5136 Other intervertebral disc degeneration, lumbar region: Secondary | ICD-10-CM | POA: Diagnosis not present

## 2018-04-30 DIAGNOSIS — M9903 Segmental and somatic dysfunction of lumbar region: Secondary | ICD-10-CM | POA: Diagnosis not present

## 2018-05-22 DIAGNOSIS — M5136 Other intervertebral disc degeneration, lumbar region: Secondary | ICD-10-CM | POA: Diagnosis not present

## 2018-05-22 DIAGNOSIS — M9903 Segmental and somatic dysfunction of lumbar region: Secondary | ICD-10-CM | POA: Diagnosis not present

## 2018-05-28 DIAGNOSIS — M5136 Other intervertebral disc degeneration, lumbar region: Secondary | ICD-10-CM | POA: Diagnosis not present

## 2018-05-28 DIAGNOSIS — M9903 Segmental and somatic dysfunction of lumbar region: Secondary | ICD-10-CM | POA: Diagnosis not present

## 2018-06-18 DIAGNOSIS — Z6826 Body mass index (BMI) 26.0-26.9, adult: Secondary | ICD-10-CM | POA: Diagnosis not present

## 2018-06-18 DIAGNOSIS — L03211 Cellulitis of face: Secondary | ICD-10-CM | POA: Diagnosis not present

## 2018-06-30 DIAGNOSIS — M5136 Other intervertebral disc degeneration, lumbar region: Secondary | ICD-10-CM | POA: Diagnosis not present

## 2018-06-30 DIAGNOSIS — M9903 Segmental and somatic dysfunction of lumbar region: Secondary | ICD-10-CM | POA: Diagnosis not present

## 2018-06-30 DIAGNOSIS — E782 Mixed hyperlipidemia: Secondary | ICD-10-CM | POA: Diagnosis not present

## 2018-06-30 DIAGNOSIS — R42 Dizziness and giddiness: Secondary | ICD-10-CM | POA: Diagnosis not present

## 2018-07-05 DIAGNOSIS — E782 Mixed hyperlipidemia: Secondary | ICD-10-CM | POA: Diagnosis not present

## 2018-07-25 DIAGNOSIS — Z6826 Body mass index (BMI) 26.0-26.9, adult: Secondary | ICD-10-CM | POA: Diagnosis not present

## 2018-07-25 DIAGNOSIS — B9689 Other specified bacterial agents as the cause of diseases classified elsewhere: Secondary | ICD-10-CM | POA: Diagnosis not present

## 2018-07-25 DIAGNOSIS — J038 Acute tonsillitis due to other specified organisms: Secondary | ICD-10-CM | POA: Diagnosis not present

## 2018-07-25 DIAGNOSIS — J029 Acute pharyngitis, unspecified: Secondary | ICD-10-CM | POA: Diagnosis not present

## 2019-02-13 DIAGNOSIS — Z23 Encounter for immunization: Secondary | ICD-10-CM | POA: Diagnosis not present

## 2019-04-01 ENCOUNTER — Other Ambulatory Visit: Payer: Self-pay | Admitting: Obstetrics and Gynecology

## 2019-04-01 DIAGNOSIS — Z1231 Encounter for screening mammogram for malignant neoplasm of breast: Secondary | ICD-10-CM

## 2019-04-21 DIAGNOSIS — Z85828 Personal history of other malignant neoplasm of skin: Secondary | ICD-10-CM | POA: Diagnosis not present

## 2019-04-21 DIAGNOSIS — L728 Other follicular cysts of the skin and subcutaneous tissue: Secondary | ICD-10-CM | POA: Diagnosis not present

## 2019-04-21 DIAGNOSIS — D229 Melanocytic nevi, unspecified: Secondary | ICD-10-CM | POA: Diagnosis not present

## 2019-04-21 DIAGNOSIS — D225 Melanocytic nevi of trunk: Secondary | ICD-10-CM | POA: Diagnosis not present

## 2019-04-21 DIAGNOSIS — D485 Neoplasm of uncertain behavior of skin: Secondary | ICD-10-CM | POA: Diagnosis not present

## 2019-04-21 DIAGNOSIS — B078 Other viral warts: Secondary | ICD-10-CM | POA: Diagnosis not present

## 2019-04-21 DIAGNOSIS — D2239 Melanocytic nevi of other parts of face: Secondary | ICD-10-CM | POA: Diagnosis not present

## 2019-05-28 ENCOUNTER — Ambulatory Visit
Admission: RE | Admit: 2019-05-28 | Discharge: 2019-05-28 | Disposition: A | Discharge status: Home / Self Care | Payer: BC Managed Care – PPO | Source: Ambulatory Visit | Attending: Obstetrics and Gynecology | Admitting: Obstetrics and Gynecology

## 2019-05-28 ENCOUNTER — Other Ambulatory Visit: Payer: Self-pay

## 2019-05-28 DIAGNOSIS — Z1231 Encounter for screening mammogram for malignant neoplasm of breast: Secondary | ICD-10-CM | POA: Diagnosis not present

## 2019-06-02 ENCOUNTER — Other Ambulatory Visit: Payer: Self-pay | Admitting: Obstetrics and Gynecology

## 2019-06-02 DIAGNOSIS — R928 Other abnormal and inconclusive findings on diagnostic imaging of breast: Secondary | ICD-10-CM

## 2019-06-09 ENCOUNTER — Ambulatory Visit
Admission: RE | Admit: 2019-06-09 | Discharge: 2019-06-09 | Disposition: A | Discharge status: Home / Self Care | Payer: BC Managed Care – PPO | Source: Ambulatory Visit | Attending: Obstetrics and Gynecology | Admitting: Obstetrics and Gynecology

## 2019-06-09 ENCOUNTER — Other Ambulatory Visit: Payer: Self-pay

## 2019-06-09 DIAGNOSIS — N6001 Solitary cyst of right breast: Secondary | ICD-10-CM | POA: Diagnosis not present

## 2019-06-09 DIAGNOSIS — R928 Other abnormal and inconclusive findings on diagnostic imaging of breast: Secondary | ICD-10-CM

## 2020-02-28 IMAGING — MG DIGITAL SCREENING BILATERAL MAMMOGRAM WITH TOMO AND CAD
6 of 10 series · 6 of 30 positions shown · non-contrast
Comparison: Previous exam(s).

CLINICAL DATA: Screening.

EXAM:
DIGITAL SCREENING BILATERAL MAMMOGRAM WITH TOMO AND CAD

[R CC synth-2D (1 of 2)]
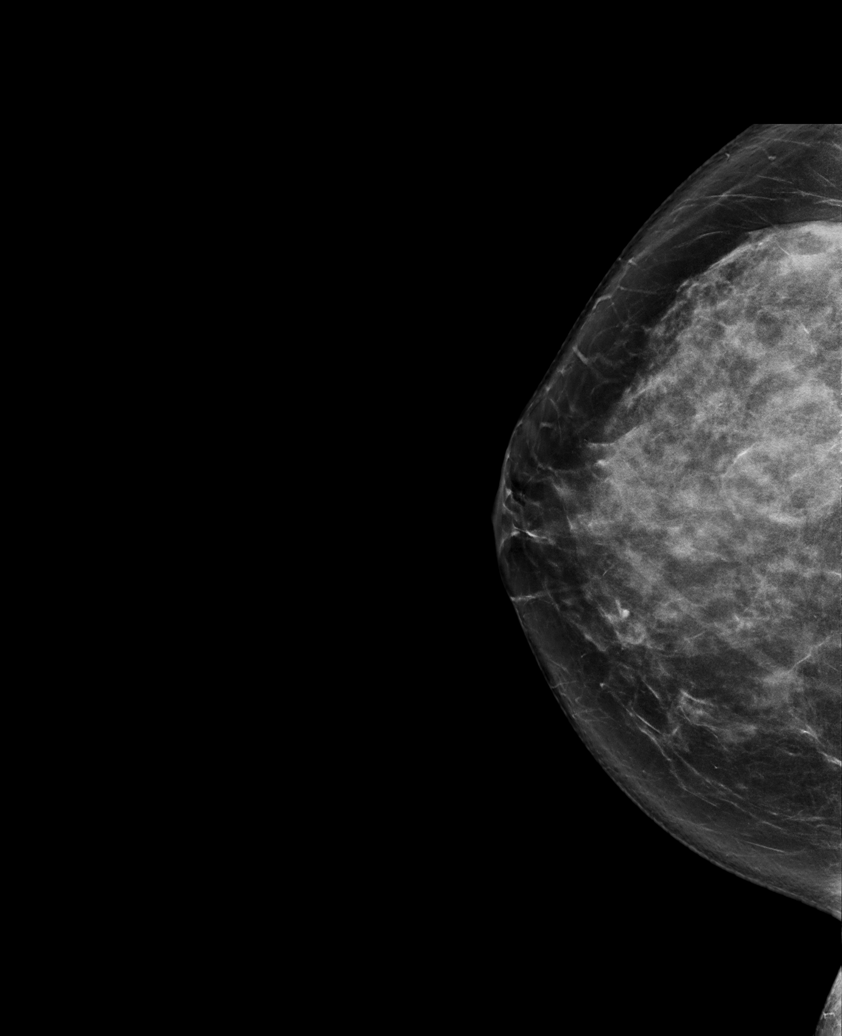

[L CC synth-2D]
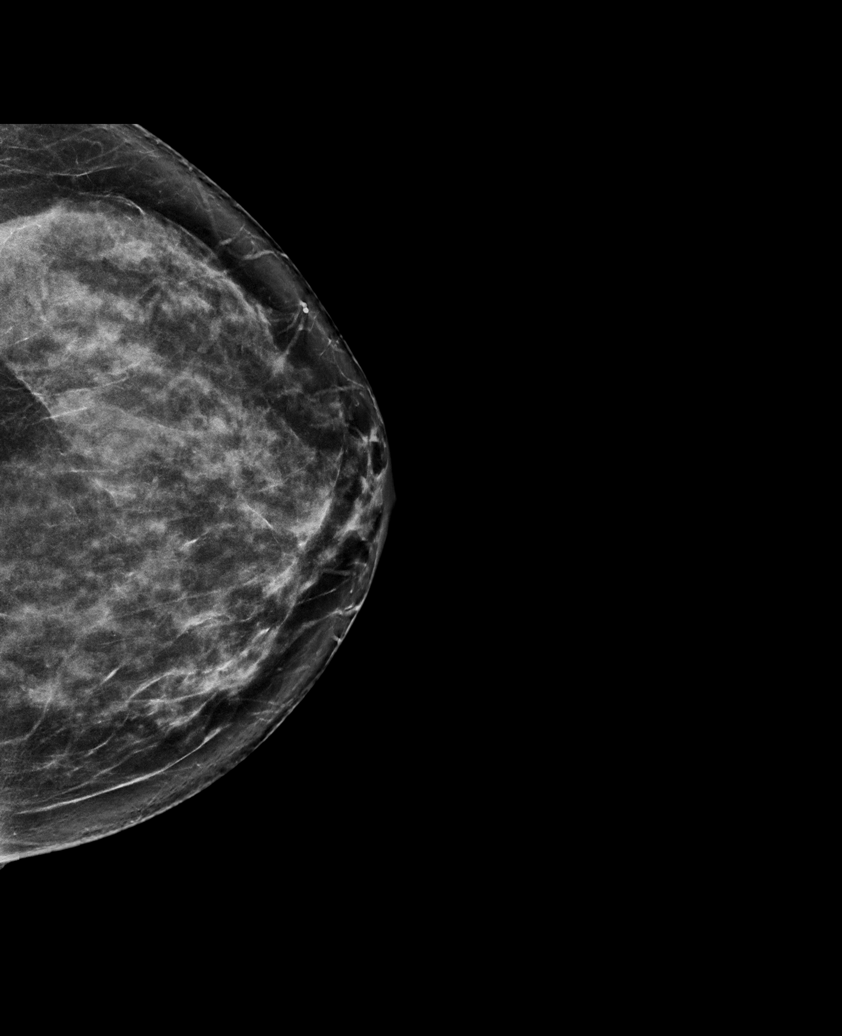

[L MLO synth-2D]
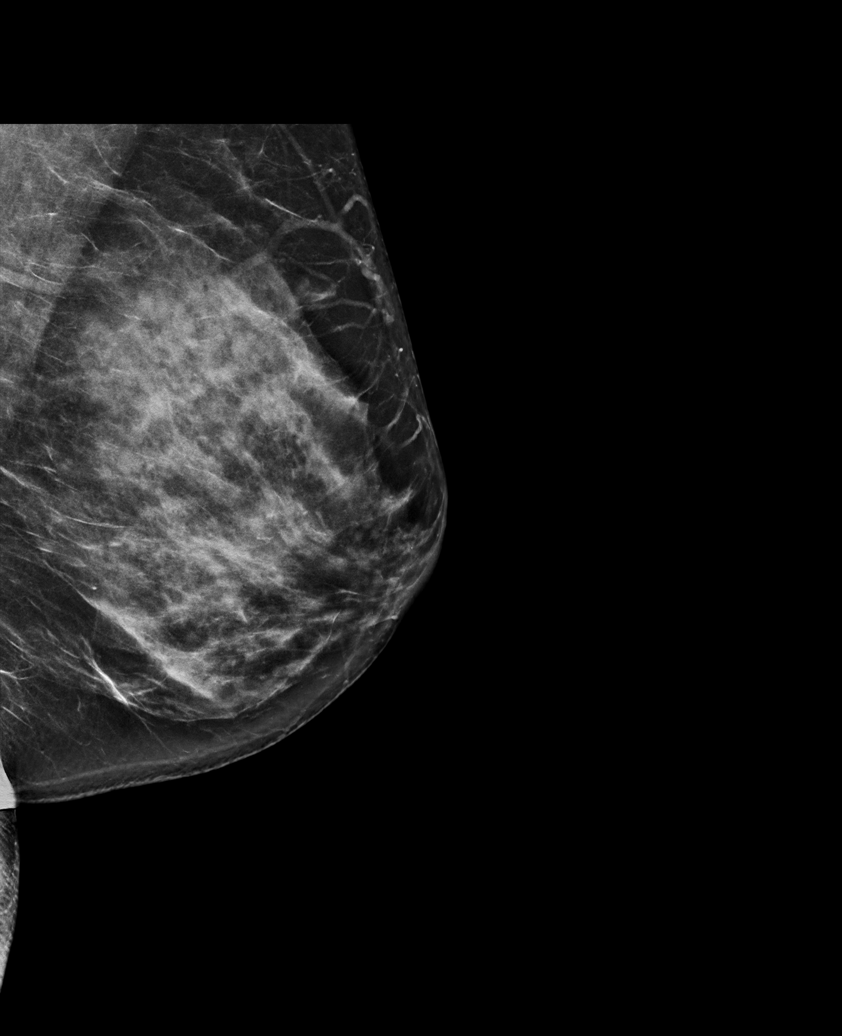

[R CC synth-2D (2 of 2)]
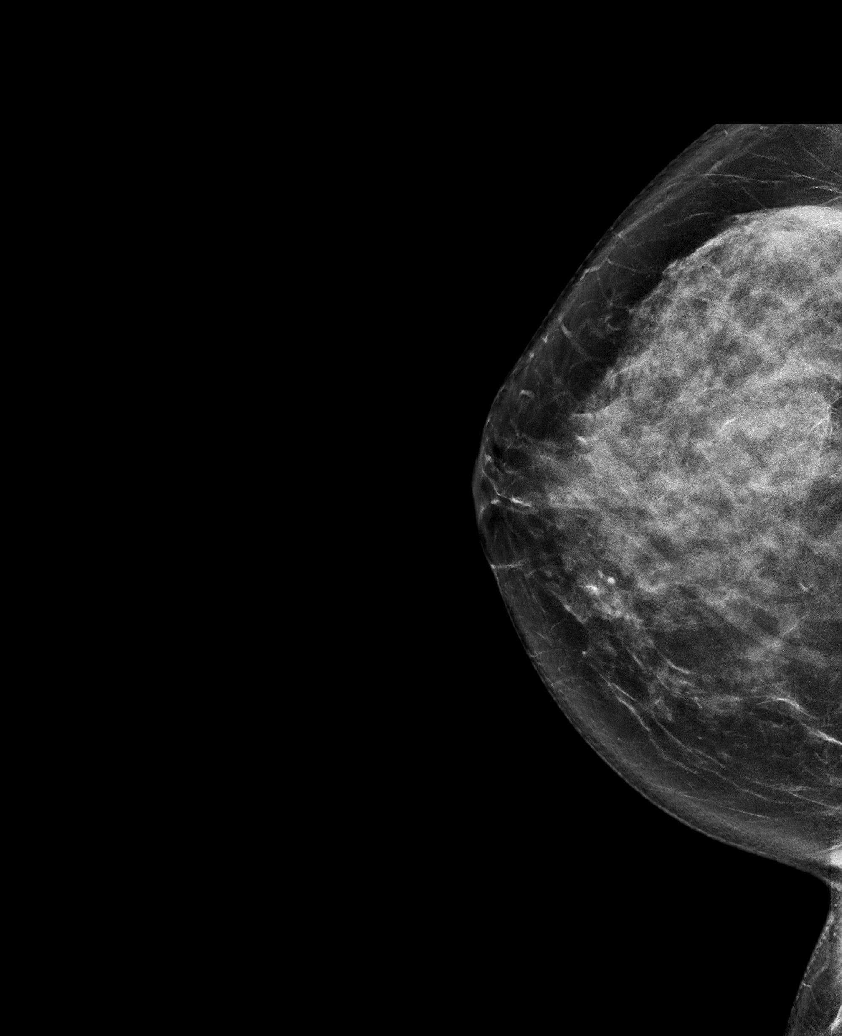

[R MLO synth-2D]
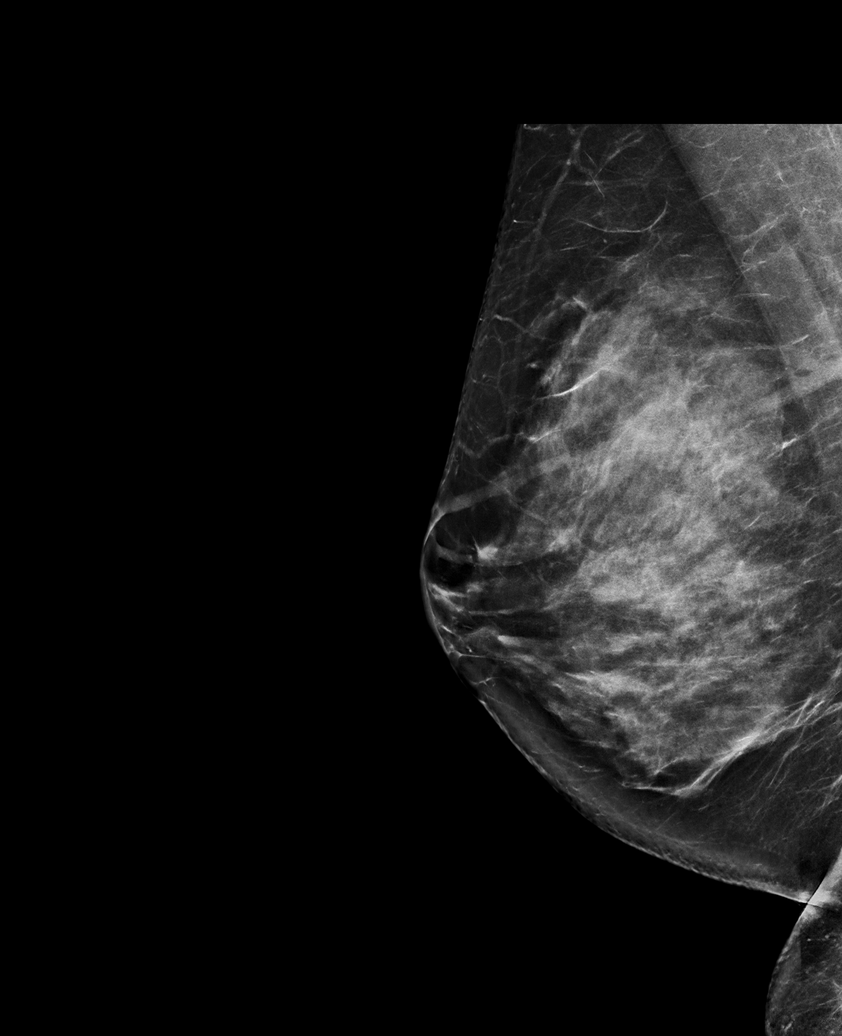

[R MLO tomo · tomo slice 45/88.0]
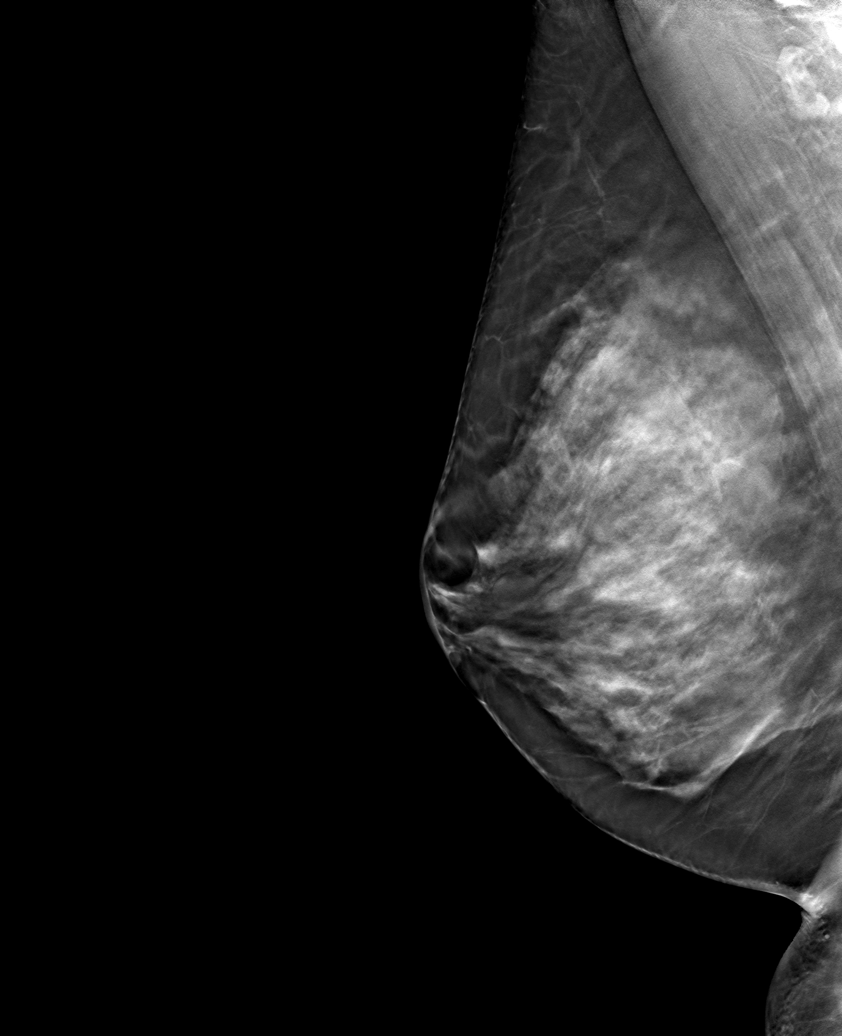

[6 of 30 positions shown; findings below may reference images not displayed]

ACR Breast Density Category c: The breast tissue is heterogeneously
dense, which may obscure small masses.
FINDINGS: There are no findings suspicious for malignancy. Images were
processed with CAD.
IMPRESSION: No mammographic evidence of malignancy. A result letter of this
screening mammogram will be mailed directly to the patient.

RECOMMENDATION:
Screening mammogram in one year. (Code:[id number]-LHB)

BI-RADS CATEGORY  1: Negative.

## 2020-11-03 ENCOUNTER — Other Ambulatory Visit: Payer: Self-pay | Admitting: Family Medicine

## 2020-11-03 DIAGNOSIS — N6320 Unspecified lump in the left breast, unspecified quadrant: Secondary | ICD-10-CM

## 2020-11-22 ENCOUNTER — Other Ambulatory Visit: Payer: Self-pay | Admitting: Family Medicine

## 2020-11-22 ENCOUNTER — Ambulatory Visit
Admission: RE | Admit: 2020-11-22 | Discharge: 2020-11-22 | Disposition: A | Discharge status: Home / Self Care | Payer: 59 | Source: Ambulatory Visit | Attending: Family Medicine | Admitting: Family Medicine

## 2020-11-22 ENCOUNTER — Other Ambulatory Visit: Payer: Self-pay

## 2020-11-22 DIAGNOSIS — N6320 Unspecified lump in the left breast, unspecified quadrant: Secondary | ICD-10-CM

## 2020-11-22 HISTORY — PX: BREAST BIOPSY: SHX20

## 2021-05-14 ENCOUNTER — Other Ambulatory Visit: Payer: Self-pay

## 2021-05-14 ENCOUNTER — Ambulatory Visit
Admission: EM | Admit: 2021-05-14 | Discharge: 2021-05-14 | Disposition: A | Discharge status: Home / Self Care | Payer: 59 | Attending: Family Medicine | Admitting: Family Medicine

## 2021-05-14 DIAGNOSIS — U071 COVID-19: Secondary | ICD-10-CM

## 2021-05-14 MED ORDER — MOLNUPIRAVIR EUA 200MG CAPSULE: 4.0000 | ORAL_CAPSULE | Freq: Two times a day (BID) | ORAL | 0 refills | Status: AC

## 2021-05-14 MED ORDER — PROMETHAZINE-DM 6.25-15 MG/5ML PO SYRP: 5.0000 mL | ORAL_SOLUTION | Freq: Four times a day (QID) | ORAL | 0 refills | Status: AC | PRN

## 2021-05-14 NOTE — ED Provider Notes (Signed)
[facility]    CSN: [id number] Arrival date & time: 05/14/21  1305      History   Chief Complaint Chief Complaint  Patient presents with   positive Covid Test    HPI Kimberly Holt is a 46 y.o. female.   Presenting today with 1 day history of fever, chills, hot flashes, congestion, cough, fatigue. Denies CP, SOB, abdominal pain, N/V/D. States she took a home COVID test that was pos and did a virtual visit but was told she needed to come to UC to be tested for the flu and given medications. No known hx of pulmonary dz. Trying OTC fever reducers and congestion medications with no relief.      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    Migraines    PONV (postoperative nausea and vomiting)    Vaginal Pap smear, abnormal    rpt was WNL    Patient Active Problem List   Diagnosis Date Noted   Acute pulmonary edema ([place]) 07/03/2013   Anemia 06/28/2013   Generalized anxiety disorder 06/26/2013   Chorioamnionitis 06/26/2013   Postpartum fever 06/26/2013   Diarrhea 06/25/2013   Vaginal delivery 06/23/2013   Second-degree perineal laceration, with delivery 06/23/2013   Hyperemesis 01/29/2013   History of kidney stones 11/03/2012   HSV (herpes simplex virus) infection 08/12/2012   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/12/2012   Migraines 08/12/2012    Past Surgical History:  Procedure Laterality Date   APPENDECTOMY  AGE 47   CYSTOSCOPY/RETROGRADE/URETEROSCOPY  10/05/2011   Procedure: CYSTOSCOPY/RETROGRADE/URETEROSCOPY;  Surgeon: Hanley Ben, MD;  Location: [facility];  Service: Urology;  Laterality: Right;   DILITATION & CURRETTAGE/HYSTROSCOPY WITH NOVASURE ABLATION N/A  04/27/2014   Procedure: DILATATION & CURETTAGE/HYSTEROSCOPY WITH NOVASURE ABLATION;  Surgeon: Ena Dawley, MD;  Location: [facility];  Service: Gynecology;  Laterality: N/A;   LAPAROSCOPIC BILATERAL SALPINGECTOMY Bilateral 04/27/2014   Procedure: LAPAROSCOPIC BILATERAL SALPINGECTOMY;  Surgeon: Ena Dawley, MD;  Location: [facility];  Service: Gynecology;  Laterality: Bilateral;   PERINEOPLASTY N/A 04/27/2014   Procedure: PERINEOPLASTY;  Surgeon: Ena Dawley, MD;  Location: [facility];  Service: Gynecology;  Laterality: N/A;   WISDOM TOOTH EXTRACTION      OB History     Gravida  6   Para  3   Term  2   Preterm      AB  3   Living  2      SAB  3   IAB      Ectopic      Multiple      Live Births  2            Home Medications    Prior to Admission medications   Medication Sig Start Date End Date Taking? Authorizing Provider  molnupiravir EUA (LAGEVRIO) 200 mg CAPS capsule Take 4 capsules (800 mg total) by mouth 2 (two) times daily for 5 days. 05/14/21 05/19/21 Yes Volney American, PA-C  promethazine-dextromethorphan (PROMETHAZINE-DM) 6.25-15 MG/5ML syrup Take 5 mLs by mouth 4 (four) times daily as needed. 05/14/21  Yes Volney American, PA-C  acetaminophen (TYLENOL) 500 MG tablet Take  1,000 mg by mouth every 6 (six) hours as needed.    [provider]  ibuprofen (ADVIL,MOTRIN) 800 MG tablet Take 1 tablet (800 mg total) by mouth every 8 (eight) hours as needed. 04/27/14   Ena Dawley, MD  ketorolac (TORADOL) 10 MG tablet Take 1 tablet (10 mg total) by mouth every 6 (six) hours as needed. 04/27/14   Ena Dawley, MD  Prenatal Vit-Fe Fumarate-FA (PRENATAL MULTIVITAMIN) TABS tablet Take 1 tablet by mouth daily at 12 noon.    [provider]  promethazine (PHENERGAN) 12.5 MG tablet Take 1 tablet (12.5 mg total) by mouth every 6 (six) hours as needed for nausea or vomiting. 04/27/14    Ena Dawley, MD    Family History Family History  Problem Relation Age of Onset   Cancer Paternal Grandmother        lung cancer-smoked   Cancer Maternal Grandmother        breast    Hypertension Father    Heart disease Father        CAD   Diabetes Father    Cancer Paternal Aunt        breast   Heart disease Paternal Uncle     Social History Social History   Tobacco Use   Smoking status: Never   Smokeless tobacco: Never  Vaping Use   Vaping Use: Never used  Substance Use Topics   Alcohol use: No   Drug use: No     Allergies   Codeine and Hydrocodone   Review of Systems Review of Systems PER HPI  Physical Exam Triage Vital Signs ED Triage Vitals  Enc Vitals Group     BP 05/14/21 1442 101/70     Pulse Rate 05/14/21 1442 (!) 116     Resp 05/14/21 1442 16     Temp 05/14/21 1442 98.8 F (37.1 C)     Temp Source 05/14/21 1442 Oral     SpO2 05/14/21 1442 97 %     Weight --      Height --      Head Circumference --      Peak Flow --      Pain Score 05/14/21 1440 0     Pain Loc --      Pain Edu? --      Excl. in [place]? --    No data found.  Updated Vital Signs BP 101/70 (BP Location: Right Arm)    Pulse (!) 116    Temp 98.8 F (37.1 C) (Oral)    Resp 16    SpO2 97%    Breastfeeding No   Visual Acuity Right Eye Distance:   Left Eye Distance:   Bilateral Distance:    Right Eye Near:   Left Eye Near:    Bilateral Near:     Physical Exam Vitals and nursing note reviewed.  Constitutional:      Appearance: Normal appearance.  HENT:     Head: Atraumatic.     Right Ear: Tympanic membrane and external ear normal.     Left Ear: Tympanic membrane and external ear normal.     Nose: Rhinorrhea present.     Mouth/Throat:     Mouth: Mucous membranes are moist.     Pharynx: Posterior oropharyngeal erythema present.  Eyes:     Extraocular Movements: Extraocular movements intact.     Conjunctiva/sclera: Conjunctivae normal.  Cardiovascular:      Rate and Rhythm: Normal rate and regular rhythm.     Heart  sounds: Normal heart sounds.  Pulmonary:     Effort: Pulmonary effort is normal.     Breath sounds: Normal breath sounds. No wheezing or rales.  Musculoskeletal:        General: Normal range of motion.     Cervical back: Normal range of motion and neck supple.  Skin:    General: Skin is warm and dry.  Neurological:     Mental Status: She is alert and oriented to person, place, and time.  Psychiatric:        Mood and Affect: Mood normal.        Thought Content: Thought content normal.     UC Treatments / Results  Labs (all labs ordered are listed, but only abnormal results are displayed) Labs Reviewed - No data to display  EKG   Radiology No results found.  Procedures Procedures (including critical care time)  Medications Ordered in UC Medications - No data to display  Initial Impression / Assessment and Plan / UC Course  I have reviewed the triage vital signs and the nursing notes.  Pertinent labs & imaging results that were available during my care of the patient were reviewed by me and considered in my medical decision making (see chart for details).     Will start antiviral therapy, phenergan DM and discussed OTC supportive medications and home care. Strict return precautions reviewed for worsening sxs.   Final Clinical Impressions(s) / UC Diagnoses   Final diagnoses:  [id number]   Discharge Instructions   None    ED Prescriptions     Medication Sig Dispense Auth. Provider   molnupiravir EUA (LAGEVRIO) 200 mg CAPS capsule Take 4 capsules (800 mg total) by mouth 2 (two) times daily for 5 days. 40 capsule Volney American, [place]   promethazine-dextromethorphan (PROMETHAZINE-DM) 6.25-15 MG/5ML syrup Take 5 mLs by mouth 4 (four) times daily as needed. 100 mL Volney American, [place]      PDMP not reviewed this encounter.   Volney American, [place] 05/18/21 2253

## 2021-05-14 NOTE — ED Triage Notes (Signed)
Patient states she did a virtual visit with PCP and they told her that she only had a 2 day window to get some meds so they sent her here.   Patient states she did a home test and was positive for Covid.   She states she has been congested and has a cough since the beginning of December.   She states she ran a fever of 100.3  She states her temp was 102.9 this morning with hot flashes and chills.

## 2021-11-07 ENCOUNTER — Other Ambulatory Visit: Payer: Self-pay | Admitting: Family Medicine

## 2021-11-07 DIAGNOSIS — Z1231 Encounter for screening mammogram for malignant neoplasm of breast: Secondary | ICD-10-CM

## 2021-11-27 ENCOUNTER — Ambulatory Visit
Admission: RE | Admit: 2021-11-27 | Discharge: 2021-11-27 | Disposition: A | Discharge status: Home / Self Care | Payer: 59 | Source: Ambulatory Visit | Attending: Family Medicine | Admitting: Family Medicine

## 2021-11-27 ENCOUNTER — Ambulatory Visit: Payer: 59

## 2021-11-27 DIAGNOSIS — Z1231 Encounter for screening mammogram for malignant neoplasm of breast: Secondary | ICD-10-CM

## 2022-11-05 ENCOUNTER — Other Ambulatory Visit: Payer: Self-pay | Admitting: Family Medicine

## 2022-11-05 DIAGNOSIS — Z1231 Encounter for screening mammogram for malignant neoplasm of breast: Secondary | ICD-10-CM

## 2022-11-30 ENCOUNTER — Ambulatory Visit
Admission: RE | Admit: 2022-11-30 | Discharge: 2022-11-30 | Disposition: A | Discharge status: Home / Self Care | Payer: 59 | Source: Ambulatory Visit | Attending: Family Medicine | Admitting: Family Medicine

## 2022-11-30 ENCOUNTER — Ambulatory Visit: Payer: 59

## 2022-11-30 DIAGNOSIS — Z1231 Encounter for screening mammogram for malignant neoplasm of breast: Secondary | ICD-10-CM

## 2023-11-19 ENCOUNTER — Other Ambulatory Visit: Payer: Self-pay | Admitting: Family Medicine

## 2023-11-19 DIAGNOSIS — Z1231 Encounter for screening mammogram for malignant neoplasm of breast: Secondary | ICD-10-CM

## 2023-12-02 ENCOUNTER — Ambulatory Visit
Admission: RE | Admit: 2023-12-02 | Discharge: 2023-12-02 | Disposition: A | Discharge status: Home / Self Care | Source: Ambulatory Visit | Attending: Family Medicine | Admitting: Family Medicine

## 2023-12-02 DIAGNOSIS — Z1231 Encounter for screening mammogram for malignant neoplasm of breast: Secondary | ICD-10-CM
